# Patient Record
Sex: Male | Born: 1965 | Race: White | Hispanic: No | Marital: Married | Smoking: Former smoker
Health system: Southern US, Community
[De-identification: ages and names within clinical notes are randomized; demographics above are authoritative.]

## PROBLEM LIST (undated history)

## (undated) DIAGNOSIS — R51 Headache: Secondary | ICD-10-CM

## (undated) DIAGNOSIS — E785 Hyperlipidemia, unspecified: Secondary | ICD-10-CM

## (undated) DIAGNOSIS — I10 Essential (primary) hypertension: Secondary | ICD-10-CM

## (undated) DIAGNOSIS — I2510 Atherosclerotic heart disease of native coronary artery without angina pectoris: Secondary | ICD-10-CM

## (undated) DIAGNOSIS — R519 Headache, unspecified: Secondary | ICD-10-CM

## (undated) DIAGNOSIS — Z86010 Personal history of colonic polyps: Secondary | ICD-10-CM

## (undated) HISTORY — DX: Hyperlipidemia, unspecified: E78.5

## (undated) HISTORY — DX: Headache, unspecified: R51.9

## (undated) HISTORY — DX: Atherosclerotic heart disease of native coronary artery without angina pectoris: I25.10

## (undated) HISTORY — DX: Essential (primary) hypertension: I10

## (undated) HISTORY — DX: Personal history of colonic polyps: Z86.010

## (undated) HISTORY — PX: COLONOSCOPY: SHX174

## (undated) HISTORY — DX: Headache: R51

## (undated) HISTORY — PX: VASECTOMY: SHX75

## (undated) HISTORY — PX: CORONARY ANGIOPLASTY WITH STENT PLACEMENT: SHX49

---

## 1999-12-15 ENCOUNTER — Encounter (INDEPENDENT_AMBULATORY_CARE_PROVIDER_SITE_OTHER): Payer: Self-pay | Admitting: Specialist

## 1999-12-15 ENCOUNTER — Ambulatory Visit (HOSPITAL_COMMUNITY): Admission: RE | Admit: 1999-12-15 | Discharge: 1999-12-15 | Payer: Self-pay | Admitting: Gastroenterology

## 2002-12-19 ENCOUNTER — Encounter (INDEPENDENT_AMBULATORY_CARE_PROVIDER_SITE_OTHER): Payer: Self-pay | Admitting: Specialist

## 2002-12-19 ENCOUNTER — Ambulatory Visit (HOSPITAL_COMMUNITY): Admission: RE | Admit: 2002-12-19 | Discharge: 2002-12-19 | Payer: Self-pay | Admitting: Urology

## 2002-12-19 ENCOUNTER — Ambulatory Visit (HOSPITAL_BASED_OUTPATIENT_CLINIC_OR_DEPARTMENT_OTHER): Admission: RE | Admit: 2002-12-19 | Discharge: 2002-12-19 | Payer: Self-pay | Admitting: Urology

## 2004-03-02 ENCOUNTER — Ambulatory Visit (HOSPITAL_COMMUNITY): Admission: RE | Admit: 2004-03-02 | Discharge: 2004-03-02 | Payer: Self-pay | Admitting: Gastroenterology

## 2004-03-02 ENCOUNTER — Encounter (INDEPENDENT_AMBULATORY_CARE_PROVIDER_SITE_OTHER): Payer: Self-pay | Admitting: Specialist

## 2005-05-09 ENCOUNTER — Encounter: Admission: RE | Admit: 2005-05-09 | Discharge: 2005-05-09 | Payer: Self-pay | Admitting: Family Medicine

## 2008-05-27 ENCOUNTER — Ambulatory Visit: Payer: Self-pay | Admitting: Radiology

## 2008-05-27 ENCOUNTER — Ambulatory Visit: Payer: Self-pay | Admitting: Internal Medicine

## 2008-05-27 ENCOUNTER — Inpatient Hospital Stay (HOSPITAL_COMMUNITY): Admission: AD | Admit: 2008-05-27 | Discharge: 2008-05-29 | Payer: Self-pay | Admitting: Internal Medicine

## 2008-05-27 ENCOUNTER — Encounter: Payer: Self-pay | Admitting: Emergency Medicine

## 2008-05-28 ENCOUNTER — Encounter: Payer: Self-pay | Admitting: Cardiovascular Disease

## 2008-06-02 ENCOUNTER — Ambulatory Visit: Payer: Self-pay | Admitting: Cardiovascular Disease

## 2008-06-02 DIAGNOSIS — I10 Essential (primary) hypertension: Secondary | ICD-10-CM | POA: Insufficient documentation

## 2008-06-03 LAB — CONVERTED CEMR LAB
Calcium: 9.1 mg/dL (ref 8.4–10.5)
Chloride: 105 meq/L (ref 96–112)
GFR calc non Af Amer: 76.95 mL/min (ref >60)
Sodium: 144 meq/L (ref 135–145)

## 2008-06-09 ENCOUNTER — Telehealth: Payer: Self-pay | Admitting: Cardiovascular Disease

## 2008-06-23 DIAGNOSIS — E785 Hyperlipidemia, unspecified: Secondary | ICD-10-CM | POA: Insufficient documentation

## 2008-06-24 ENCOUNTER — Ambulatory Visit: Payer: Self-pay | Admitting: Cardiovascular Disease

## 2008-06-24 DIAGNOSIS — I2510 Atherosclerotic heart disease of native coronary artery without angina pectoris: Secondary | ICD-10-CM | POA: Insufficient documentation

## 2008-08-19 ENCOUNTER — Emergency Department (HOSPITAL_BASED_OUTPATIENT_CLINIC_OR_DEPARTMENT_OTHER): Admission: EM | Admit: 2008-08-19 | Discharge: 2008-08-19 | Payer: Self-pay | Admitting: Emergency Medicine

## 2008-08-19 ENCOUNTER — Encounter: Payer: Self-pay | Admitting: Cardiovascular Disease

## 2008-08-19 ENCOUNTER — Ambulatory Visit: Payer: Self-pay | Admitting: Interventional Radiology

## 2008-08-19 ENCOUNTER — Telehealth: Payer: Self-pay | Admitting: Cardiovascular Disease

## 2008-08-24 ENCOUNTER — Ambulatory Visit: Payer: Self-pay | Admitting: Cardiovascular Disease

## 2008-08-24 LAB — CONVERTED CEMR LAB
ALT: 28 U/L (ref 0–53)
AST: 25 U/L (ref 0–37)
Albumin: 4.1 g/dL (ref 3.5–5.2)
Alkaline Phosphatase: 55 U/L (ref 39–117)
Bilirubin, Direct: 0.1 mg/dL (ref 0.0–0.3)
Total Bilirubin: 1 mg/dL (ref 0.3–1.2)
Total Protein: 7 g/dL (ref 6.0–8.3)

## 2008-09-03 LAB — CONVERTED CEMR LAB
HDL: 42.6 mg/dL (ref >39.00)
Total CHOL/HDL Ratio: 4

## 2008-10-20 ENCOUNTER — Telehealth: Payer: Self-pay | Admitting: Cardiovascular Disease

## 2008-11-25 ENCOUNTER — Ambulatory Visit: Payer: Self-pay | Admitting: Cardiovascular Disease

## 2008-11-30 LAB — CONVERTED CEMR LAB
ALT: 25 units/L (ref 0–53)
Albumin: 4.1 g/dL (ref 3.5–5.2)
Bilirubin, Direct: 0 mg/dL (ref 0.0–0.3)
HDL: 34.4 mg/dL — ABNORMAL LOW (ref >39.00)
Total Bilirubin: 0.8 mg/dL (ref 0.3–1.2)
Total Protein: 6.3 g/dL (ref 6.0–8.3)
Triglycerides: 111 mg/dL (ref 0.0–149.0)
VLDL: 22.2 mg/dL (ref 0.0–40.0)

## 2008-12-11 ENCOUNTER — Telehealth: Payer: Self-pay | Admitting: Cardiovascular Disease

## 2009-02-10 ENCOUNTER — Telehealth: Payer: Self-pay | Admitting: Cardiovascular Disease

## 2009-03-29 ENCOUNTER — Telehealth: Payer: Self-pay | Admitting: Cardiovascular Disease

## 2009-08-05 ENCOUNTER — Telehealth: Payer: Self-pay | Admitting: Cardiovascular Disease

## 2009-09-10 ENCOUNTER — Ambulatory Visit: Payer: Self-pay | Admitting: Cardiovascular Disease

## 2009-09-14 ENCOUNTER — Telehealth: Payer: Self-pay | Admitting: Cardiovascular Disease

## 2010-03-06 LAB — CONVERTED CEMR LAB
ALT: 22 units/L (ref 0–53)
BUN: 17 mg/dL (ref 6–23)
Creatinine, Ser: 1.1 mg/dL (ref 0.4–1.5)
Glucose, Bld: 96 mg/dL (ref 70–99)
HDL: 36.6 mg/dL — ABNORMAL LOW (ref >39.00)
Total Bilirubin: 0.8 mg/dL (ref 0.3–1.2)
Total CHOL/HDL Ratio: 5
Total Protein: 7.2 g/dL (ref 6.0–8.3)
VLDL: 21.2 mg/dL (ref 0.0–40.0)

## 2010-03-08 NOTE — Assessment & Plan Note (Signed)
Summary: f1y   Visit Type:  Follow-up Primary Provider:  none  CC:  f1y.  Pt states he is feeling fine.  He was supposed to be back in January but did not receive a call to come back..  History of Present Illness: This is a 45 year old gentleman who presented with unstable angina 05/27/08. He underwent diagnostic catheterization that showed severe proximal LAD stenosis and subtotal occlusion of the distal left circumflex. The left circumflex was not suitable for PCI and his LAD was stented with a single drug-eluting stent. He presents today for followup.  He has been exercising regularly and denies exertional symptoms. No chest pain, dyspnea, edema, orthopnea, or PND. He has lost about 10 pounds. He reports compliance with his medical program and continues to abstain from cigarettes.     Current Medications (verified): 1)  Plavix 75 Mg Tabs (Clopidogrel Bisulfate) .... Take One Tablet By Mouth Daily 2)  Simvastatin 40 Mg Tabs (Simvastatin) .... Take One Tablet By Mouth Daily At Bedtime 3)  Metoprolol Tartrate 25 Mg Tabs (Metoprolol Tartrate) .... Take One Tab Once Daily 4)  Aspirin Ec 325 Mg Tbec (Aspirin) .... Take One Tablet By Mouth Daily 5)  Lisinopril 10 Mg Tabs (Lisinopril) .... Take One Tablet By Mouth Daily  Allergies (verified): No Known Drug Allergies  Past History:  Past medical history reviewed for relevance to current acute and chronic problems.  Past Medical History: Reviewed history from 06/23/2008 and no changes required. Current Problems:  CAD (ICD-414.00)- severe proximal left anterior descending stenosis stenting using a drug-eluting Xience stent  to proximal left anterior descending.   HYPERTENSION, BENIGN (ICD-401.1) DYSLIPIDEMIA (ICD-272.4)  Review of Systems       Negative except as per HPI   Vital Signs:  Patient profile:   45 year old male Height:      74 inches Weight:      231 pounds BMI:     29.77 Pulse rate:   68 / minute Pulse rhythm:    regular Resp:     16 per minute BP sitting:   124 / 78  (left arm) Cuff size:   regular  Vitals Entered By: Judithe Modest CMA (September 10, 2009 8:47 AM)  Physical Exam  General:  Pt is alert and oriented, in no acute distress. HEENT: normal Neck: normal carotid upstrokes without bruits, JVP normal Lungs: CTA CV: RRR without murmur or gallop Abd: soft, NT, positive BS, no bruit, no organomegaly Ext: no clubbing, cyanosis, or edema. peripheral pulses 2+ and equal Skin: warm and dry without rash    EKG  Procedure date:  09/10/2009  Findings:      NSR 68 bpm, within normal limits.  Impression & Recommendations:  Problem # 1:  CORONARY ATHEROSCLEROSIS NATIVE CORONARY ARTERY (ICD-414.01) Stable without angina. Continue current medical program with secondary risk reduction measures as below. Encouraged continued efforts at diet and exercise.  His updated medication list for this problem includes:    Plavix 75 Mg Tabs (Clopidogrel bisulfate) .Marland Kitchen... Take one tablet by mouth daily    Metoprolol Tartrate 25 Mg Tabs (Metoprolol tartrate) .Marland Kitchen... Take one tab once daily    Aspirin Ec 325 Mg Tbec (Aspirin) .Marland Kitchen... Take one tablet by mouth daily    Lisinopril 10 Mg Tabs (Lisinopril) .Marland Kitchen... Take one tablet by mouth daily  Orders: EKG w/ Interpretation (93000) TLB-BMP (Basic Metabolic Panel-BMET) (80048-METABOL) TLB-Lipid Panel (80061-LIPID) TLB-Hepatic/Liver Function Pnl (80076-HEPATIC)  Problem # 2:  HYPERTENSION, BENIGN (ICD-401.1)  Well-controlled on lisinopril  and metoprolol.  His updated medication list for this problem includes:    Metoprolol Tartrate 25 Mg Tabs (Metoprolol tartrate) .Marland Kitchen... Take one tab once daily    Aspirin Ec 325 Mg Tbec (Aspirin) .Marland Kitchen... Take one tablet by mouth daily    Lisinopril 10 Mg Tabs (Lisinopril) .Marland Kitchen... Take one tablet by mouth daily  BP today: 124/78 Prior BP: 124/86 (08/24/2008)  Labs Reviewed: K+: 3.8 (06/02/2008) Creat: : 1.1 (06/02/2008)   Chol:  165 (11/25/2008)   HDL: 34.40 (11/25/2008)   LDL: 108 (11/25/2008)   TG: 111.0 (11/25/2008)  Orders: TLB-BMP (Basic Metabolic Panel-BMET) (80048-METABOL) TLB-Lipid Panel (80061-LIPID) TLB-Hepatic/Liver Function Pnl (80076-HEPATIC)  Problem # 3:  DYSLIPIDEMIA (ICD-272.4)  LDL was a little above goal last year. Limited by need for generic Rx - continue simvastatin for now and repeat lipids/lft's.  His updated medication list for this problem includes:    Simvastatin 40 Mg Tabs (Simvastatin) .Marland Kitchen... Take one tablet by mouth daily at bedtime  CHOL: 165 (11/25/2008)   LDL: 108 (11/25/2008)   HDL: 34.40 (11/25/2008)   TG: 111.0 (11/25/2008)  Orders: TLB-BMP (Basic Metabolic Panel-BMET) (80048-METABOL) TLB-Lipid Panel (80061-LIPID) TLB-Hepatic/Liver Function Pnl (80076-HEPATIC)  Patient Instructions: 1)  Your physician recommends that you have lab work today. (Lipid, Liver and BMP) 2)  Your physician has requested that you have an exercise tolerance test in 1 YEAR.  For further information please visit [email or web address]  Please also follow instruction sheet, as given. 3)  Your physician recommends that you continue on your current medications as directed. Please refer to the Current Medication list given to you today.

## 2010-03-08 NOTE — Progress Notes (Signed)
Summary: Question about blood sugar  Phone Note Call from Patient Call back at [phone]   Summary of Call: Pt have question about blood sugar Initial call taken by: Judie Grieve,  August 05, 2009 2:09 PM  Follow-up for Phone Call        pt called concerning his blood sugar.  He has a friend that has a glucometer and he took his after eatting fried fish and hush puppies.  It was 270.  I told his that was not a fasting sample and he should get a fasting sample if he was concerned.  He was due to see Dr Excell Seltzer in 02/2009 and was never called for follow up.  Will get this scheduled and he will come in one week prior and get fasting labs.  He says he feels fine playing tennis weekly w/o shet pain or any difficulty. Dennis Bast, RN, BSN  August 05, 2009 2:34 PM

## 2010-03-08 NOTE — Progress Notes (Signed)
Summary: Chantix  Phone Note Call from Patient Call back at Home Phone [phone] Call back at [phone] Message from:  Patient on March 29, 2009 12:39 PM  Refills Requested: Medication #1:  CHANTIX STARTING MONTH PAK 0.5 MG X 11 & 1 MG X 42 TABS take as directed. [facility] [address]   Method Requested: Fax to Local Pharmacy Initial call taken by: Lorne Skeens,  March 29, 2009 12:40 PM Caller: Patient Reason for Call: Talk to Nurse Details for Reason: Per pt calling, wife stacy Jorden also need rx for chantix Initial call taken by: Lorne Skeens,  March 29, 2009 12:41 PM  Follow-up for Phone Call        I spoke with the pt and both he and his wife just completed the starter pack of Chantix.  They now need the monthly dose pack.  Rx for the pt's wife was called into [facility]. Follow-up by: Julieta Gutting, RN, BSN,  March 29, 2009 1:56 PM    New/Updated Medications: CHANTIX CONTINUING MONTH PAK 1 MG TABS (VARENICLINE TARTRATE) take as directed Prescriptions: CHANTIX CONTINUING MONTH PAK 1 MG TABS (VARENICLINE TARTRATE) take as directed  #1 x 2   Entered by:   Julieta Gutting, RN, BSN   Authorized by:   Norva Karvonen, MD   Signed by:   Julieta Gutting, RN, BSN on 03/29/2009   Method used:   Electronically to        [facility] [address] [id number] (retail)       [address]       [place], [place]  [address]       Ph: [phone]       Fax: [phone]   RxID:   [id number]

## 2010-03-08 NOTE — Progress Notes (Signed)
Summary: Chantix  Phone Note Call from Patient Call back at [phone]   Caller: Patient Reason for Call: Talk to Nurse Summary of Call: Patient would like to speak to Lauren. Initial call taken by: Burnard Leigh,  February 10, 2009 11:41 AM  Follow-up for Phone Call        Pt called the office because he and his wife did not pick-up the Chantix prescription that was prescribed in November.  The pt and his wife would like to start Chantix at this time.  The pt's prescription is still on file at the pharmacy but the pharmacy cannot locate Rx for Tim Edwards DOB 11/25/1967.  I called [facility] and gave new presciptions for both the pt and his wife.  The pt will call back to schedule an appt in February with Dr Excell Seltzer.  Follow-up by: Julieta Gutting, RN, BSN,  February 10, 2009 1:09 PM

## 2010-03-08 NOTE — Progress Notes (Signed)
Summary:  lab results  Phone Note Call from Patient Call back at Home Phone [phone]   Caller: Patient Reason for Call: Talk to Nurse, Talk to Doctor, Lab or Test Results Summary of Call: pt would like lab results from Friday Initial call taken by: Omer Jack,  September 14, 2009 12:09 PM  Follow-up for Phone Call        I spoke with the pt about the results of his labwork.  Currently cost is an issue with his medications.  The pt said he would talk with his pharmacist about what Crestor may cost him.  I spoke with the pt about improving his diet and continuing to exercise. The pt will call back if he can afford Crestor.  Follow-up by: Julieta Gutting, RN, BSN,  September 14, 2009 12:54 PM

## 2010-05-10 ENCOUNTER — Other Ambulatory Visit: Payer: Self-pay | Admitting: Cardiovascular Disease

## 2010-05-15 LAB — COMPREHENSIVE METABOLIC PANEL
Calcium: 9 mg/dL (ref 8.4–10.5)
Chloride: 107 mEq/L (ref 96–112)
GFR calc Af Amer: >60 mL/min (ref >60)
Sodium: 146 mEq/L — ABNORMAL HIGH (ref 135–145)
Total Bilirubin: 0.6 mg/dL (ref 0.3–1.2)
Total Protein: 7.5 g/dL (ref 6.0–8.3)

## 2010-05-15 LAB — CBC
HCT: 42.9 % (ref 39.0–52.0)
Hemoglobin: 14.5 g/dL (ref 13.0–17.0)
MCHC: 33.8 g/dL (ref 30.0–36.0)
MCV: 86.7 fL (ref 78.0–100.0)
Platelets: 149 10*3/uL — ABNORMAL LOW (ref 150–400)
RDW: 11.9 % (ref 11.5–15.5)
WBC: 6.9 10*3/uL (ref 4.0–10.5)

## 2010-05-15 LAB — DIFFERENTIAL
Eosinophils Absolute: 0.1 10*3/uL (ref 0.0–0.7)
Lymphocytes Relative: 21 % (ref 12–46)
Neutrophils Relative %: 68 % (ref 43–77)

## 2010-05-15 LAB — POCT CARDIAC MARKERS
Myoglobin, poc: 73.6 ng/mL (ref 12–200)
Troponin i, poc: <0.05 ng/mL (ref 0.00–0.09)

## 2010-05-15 LAB — LIPASE, BLOOD: Lipase: 53 U/L (ref 23–300)

## 2010-05-18 LAB — CBC
Hemoglobin: 14.5 g/dL (ref 13.0–17.0)
MCHC: 35.2 g/dL (ref 30.0–36.0)
MCV: 86.7 fL (ref 78.0–100.0)
MCV: 87.6 fL (ref 78.0–100.0)
Platelets: 112 10*3/uL — ABNORMAL LOW (ref 150–400)
RBC: 4.78 MIL/uL (ref 4.22–5.81)
RBC: 5.3 MIL/uL (ref 4.22–5.81)
RBC: 5.4 MIL/uL (ref 4.22–5.81)
WBC: 6.2 10*3/uL (ref 4.0–10.5)
WBC: 6.9 10*3/uL (ref 4.0–10.5)

## 2010-05-18 LAB — BASIC METABOLIC PANEL
BUN: 13 mg/dL (ref 6–23)
CO2: 27 mEq/L (ref 19–32)
Calcium: 8.8 mg/dL (ref 8.4–10.5)
Chloride: 106 mEq/L (ref 96–112)
Chloride: 106 mEq/L (ref 96–112)
Creatinine, Ser: 0.9 mg/dL (ref 0.4–1.5)
Creatinine, Ser: 1.08 mg/dL (ref 0.4–1.5)
GFR calc Af Amer: >60 mL/min (ref >60)
GFR calc Af Amer: >60 mL/min (ref >60)
Glucose, Bld: 99 mg/dL (ref 70–99)
Potassium: 4 mEq/L (ref 3.5–5.1)
Sodium: 139 mEq/L (ref 135–145)

## 2010-05-18 LAB — CARDIAC PANEL(CRET KIN+CKTOT+MB+TROPI)
CK, MB: 2.1 ng/mL (ref 0.3–4.0)
Relative Index: 0.4 (ref 0.0–2.5)
Relative Index: 0.4 (ref 0.0–2.5)
Total CK: 470 U/L — ABNORMAL HIGH (ref 7–232)
Troponin I: 0.01 ng/mL (ref 0.00–0.06)
Troponin I: 0.02 ng/mL (ref 0.00–0.06)

## 2010-05-18 LAB — DIFFERENTIAL
Basophils Absolute: 0 10*3/uL (ref 0.0–0.1)
Basophils Relative: 1 % (ref 0–1)
Eosinophils Relative: 2 % (ref 0–5)
Lymphs Abs: 1.8 10*3/uL (ref 0.7–4.0)
Monocytes Absolute: 0.3 10*3/uL (ref 0.1–1.0)
Monocytes Absolute: 0.4 10*3/uL (ref 0.1–1.0)
Monocytes Relative: 5 % (ref 3–12)
Monocytes Relative: 7 % (ref 3–12)
Neutro Abs: 4.1 10*3/uL (ref 1.7–7.7)
Neutrophils Relative %: 60 % (ref 43–77)

## 2010-05-18 LAB — COMPREHENSIVE METABOLIC PANEL
AST: 21 U/L (ref 0–37)
Calcium: 8.8 mg/dL (ref 8.4–10.5)
Chloride: 110 mEq/L (ref 96–112)
GFR calc Af Amer: >60 mL/min (ref >60)
GFR calc non Af Amer: >60 mL/min (ref >60)
Potassium: 4.5 mEq/L (ref 3.5–5.1)
Sodium: 144 mEq/L (ref 135–145)

## 2010-05-18 LAB — POCT CARDIAC MARKERS: CKMB, poc: 1.7 ng/mL (ref 1.0–8.0)

## 2010-05-18 LAB — LIPID PANEL
Cholesterol: 267 mg/dL — ABNORMAL HIGH (ref 0–200)
HDL: 30 mg/dL — ABNORMAL LOW (ref >39)
Total CHOL/HDL Ratio: 8.9 RATIO
VLDL: 31 mg/dL (ref 0–40)

## 2010-05-18 LAB — PROTIME-INR: Prothrombin Time: 13.9 seconds (ref 11.6–15.2)

## 2010-05-23 ENCOUNTER — Ambulatory Visit (INDEPENDENT_AMBULATORY_CARE_PROVIDER_SITE_OTHER): Payer: BLUE CROSS/BLUE SHIELD | Admitting: Physician Assistant

## 2010-05-23 ENCOUNTER — Telehealth: Payer: Self-pay | Admitting: Cardiovascular Disease

## 2010-05-23 ENCOUNTER — Encounter: Payer: Self-pay | Admitting: Physician Assistant

## 2010-05-23 VITALS — BP 122/92 | HR 59 | Resp 18 | Ht 74.0 in | Wt 234.0 lb

## 2010-05-23 DIAGNOSIS — I2510 Atherosclerotic heart disease of native coronary artery without angina pectoris: Secondary | ICD-10-CM

## 2010-05-23 DIAGNOSIS — R079 Chest pain, unspecified: Secondary | ICD-10-CM

## 2010-05-23 DIAGNOSIS — R42 Dizziness and giddiness: Secondary | ICD-10-CM

## 2010-05-23 DIAGNOSIS — E785 Hyperlipidemia, unspecified: Secondary | ICD-10-CM

## 2010-05-23 DIAGNOSIS — Z720 Tobacco use: Secondary | ICD-10-CM | POA: Insufficient documentation

## 2010-05-23 NOTE — Telephone Encounter (Signed)
Left message for pt to call back.  Pt scheduled to see Tereso Newcomer PA-C today at 12:00.

## 2010-05-23 NOTE — Progress Notes (Signed)
History of Present Illness: Primary Cardiologist: Dr. Tonny Bollman  Tim Edwards is a 45 y.o. male with a h/o CAD, s/p DES to LAD in 05/2008, chronically occluded left PDA with preserved LVF.  He returns for follow up.  He is somewhat anxious about his diagnosis of CAD.  He wants to get "checked out."  He is still smoking.  He denies chest pain reminiscent of his previous angina.  He is quite active.  He mows his lawn and is able to exercise without chest pain.  He does have occasional chest pain that feels like gas and he takes TUMS or belches with relief.  He does note some DOE.  This is stable and mild.  He denies any changes.  He denies syncope.  He denies orthopnea or PND.  He denies edema.  He has had episodic lightheadedness/dizziness.  This seems to be related to lack of eating.  His symptoms promptly get better with eating.  He is worried he may have diabetes.  Past Medical History  Diagnosis Date  . CAD (coronary artery disease)     a. s/p Xience DES to LAD 4/10;  b. cath 05/28/08: pLAD 90% (tx with DES); left PDA occluded, RCA ok; EF 55%  . Hypertension   . Dyslipidemia     Current Outpatient Prescriptions  Medication Sig Dispense Refill  . aspirin EC 325 MG EC tablet Take 325 mg by mouth daily.        . clopidogrel (PLAVIX) 75 MG tablet Take 75 mg by mouth daily.        Marland Kitchen lisinopril (PRINIVIL,ZESTRIL) 10 MG tablet Take 10 mg by mouth daily.        . metoprolol succinate (TOPROL-XL) 25 MG 24 hr tablet Take 25 mg by mouth daily.        . simvastatin (ZOCOR) 40 MG tablet TAKE ONE TABLET BY MOUTH NIGHTLY AT BEDTIME  90 tablet  0    No Known Allergies  Vital Signs: BP 122/92  Pulse 59  Resp 18  Ht 6\' 2"  (1.88 m)  Wt 234 lb (106.142 kg)  BMI 30.04 kg/m2  PHYSICAL EXAM: Well nourished, well developed, in no acute distress HEENT: normal Neck: no JVD Vascular: no carotid bruits Cardiac:  normal S1, S2; RRR; no murmur Lungs:  clear to auscultation bilaterally, no wheezing,  rhonchi or rales Abd: soft, nontender, no hepatomegaly Ext: no edema Skin: warm and dry Neuro:  CNs 2-12 intact, no focal abnormalities noted Psych: normal affect  EKG:  Sinus bradycardia, HR 59, normal axis, no ischemic changes.  ASSESSMENT AND PLAN:

## 2010-05-23 NOTE — Telephone Encounter (Signed)
The pt wanted to know if the PA can order lab work at his appointment today.  I made the pt aware that the PA can order tests if felt medically necessary. The pt thinks he is having problems with his blood glucose.  The pt does not have a PCP. The pt will be seen today in the office.

## 2010-05-23 NOTE — Assessment & Plan Note (Signed)
We discussed the importance of cessation.  

## 2010-05-23 NOTE — Assessment & Plan Note (Signed)
Borderline control.  We discussed proper diet and smoking cessation.

## 2010-05-23 NOTE — Assessment & Plan Note (Signed)
Check fasting sugar with CMET on day of ETT and also obtain Hgb A1C to r/o diabetes.  If within normal ranges, suggest continued attention to diet.  It sounds like he is having hypoglycemic episodes mainly related to poor diet.

## 2010-05-23 NOTE — Telephone Encounter (Signed)
Pt has question re his appt today and would like talk to lauren before his appt

## 2010-05-23 NOTE — Patient Instructions (Signed)
Your physician recommends that you schedule a follow-up appointment in: 12 months with Dr. Excell Seltzer Your physician recommends that you return for fasting lab work on day of treadmill Your physician has requested that you have an exercise tolerance test. For further information please visit [email or web address] Please also follow instruction sheet, as given.

## 2010-05-23 NOTE — Assessment & Plan Note (Signed)
Obtain ETT as noted.  Will continue ASA and Plavix.  If ETT negative, reassurance and follow up with Dr. Excell Seltzer in 12 months.

## 2010-05-23 NOTE — Assessment & Plan Note (Signed)
Atypical.  Sounds like GI.  He is concerned about his overall health.  Will arrange ETT.

## 2010-05-23 NOTE — Assessment & Plan Note (Signed)
Arrange CMET and lipids day of ETT.

## 2010-06-08 ENCOUNTER — Ambulatory Visit (INDEPENDENT_AMBULATORY_CARE_PROVIDER_SITE_OTHER): Payer: BC Managed Care – PPO | Admitting: Physician Assistant

## 2010-06-08 ENCOUNTER — Other Ambulatory Visit (INDEPENDENT_AMBULATORY_CARE_PROVIDER_SITE_OTHER): Payer: BC Managed Care – PPO | Admitting: *Deleted

## 2010-06-08 DIAGNOSIS — R079 Chest pain, unspecified: Secondary | ICD-10-CM

## 2010-06-08 DIAGNOSIS — I2510 Atherosclerotic heart disease of native coronary artery without angina pectoris: Secondary | ICD-10-CM

## 2010-06-08 DIAGNOSIS — R42 Dizziness and giddiness: Secondary | ICD-10-CM

## 2010-06-08 DIAGNOSIS — E785 Hyperlipidemia, unspecified: Secondary | ICD-10-CM

## 2010-06-08 LAB — HEPATIC FUNCTION PANEL
AST: 30 U/L (ref 0–37)
Albumin: 4.6 g/dL (ref 3.5–5.2)
Alkaline Phosphatase: 54 U/L (ref 39–117)
Total Protein: 7.5 g/dL (ref 6.0–8.3)

## 2010-06-08 LAB — BASIC METABOLIC PANEL
BUN: 17 mg/dL (ref 6–23)
CO2: 15 mEq/L — ABNORMAL LOW (ref 19–32)
Calcium: 9.4 mg/dL (ref 8.4–10.5)
Glucose, Bld: 87 mg/dL (ref 70–99)
Sodium: 140 mEq/L (ref 135–145)

## 2010-06-08 LAB — LIPID PANEL
Cholesterol: 183 mg/dL (ref 0–200)
HDL: 38.6 mg/dL — ABNORMAL LOW (ref >39.00)

## 2010-06-08 NOTE — Progress Notes (Signed)
Exercise Treadmill Test  Pre-Exercise Testing Evaluation Rhythm: sinus bradycardia  Rate: 56   PR:  .16 QRS:  .10  QT:  .40 QTc: .38     Test  Exercise Tolerance Test Ordering MD: Tonny Bollman, MD  Interpreting MD:  Tereso Newcomer PA-C  Unique Test No: 1  Treadmill:  1  Indication for ETT: exertional dyspnea  Contraindication to ETT: No   Stress Modality: exercise - treadmill  Cardiac Imaging Performed: non   Protocol: standard Bruce - maximal  Max BP: 201/66  Max MPHR (bpm):  174 85% MPR (bpm):  147  MPHR obtained (bpm):  148 % MPHR obtained: 85%  Reached 85% MPHR (min:sec): 9:40 Total Exercise Time (min-sec): 9:49  Workload in METS: 13.0 Borg Scale: 17  Reason ETT Terminated:  dyspnea    ST Segment Analysis At Rest: normal ST segments - no evidence of significant ST depression With Exercise: no evidence of significant ST depression  Other Information Arrhythmia:  No Angina during ETT:  absent (0) Quality of ETT:  diagnostic  ETT Interpretation:  normal - no evidence of ischemia by ST analysis  Comments: Good exercise tolerance. Patient took Toprol last night. Normal BP response. No chest pain. No ST changes on ECG to suggest ischemia.  Recommendations: Follow up with Dr. Excell Seltzer in one year.

## 2010-06-09 ENCOUNTER — Encounter: Payer: Self-pay | Admitting: Cardiovascular Disease

## 2010-06-09 ENCOUNTER — Telehealth: Payer: Self-pay | Admitting: Cardiovascular Disease

## 2010-06-09 LAB — HEMOGLOBIN A1C: Hgb A1c MFr Bld: 5.6 % (ref 4.6–6.5)

## 2010-06-09 MED ORDER — ATORVASTATIN CALCIUM 40 MG PO TABS: 40.0000 mg | ORAL_TABLET | Freq: Every day | ORAL | Status: DC

## 2010-06-09 NOTE — Telephone Encounter (Signed)
See result note.  

## 2010-06-09 NOTE — Telephone Encounter (Signed)
Error

## 2010-06-09 NOTE — Telephone Encounter (Signed)
Pt calling re bloodwork results.

## 2010-06-09 NOTE — Telephone Encounter (Signed)
Per documentation on the pt's lab work Danielle Rankin spoke with patient.

## 2010-06-10 ENCOUNTER — Encounter: Payer: Self-pay | Admitting: *Deleted

## 2010-06-10 ENCOUNTER — Telehealth: Payer: Self-pay | Admitting: *Deleted

## 2010-06-10 DIAGNOSIS — E785 Hyperlipidemia, unspecified: Secondary | ICD-10-CM

## 2010-06-10 NOTE — Telephone Encounter (Signed)
Explained to pt he will have his flp/lft drawn at the [facility] office during his appt with Dr. Yetta Barre on 07/20/10 @ 9:30. Danielle Rankin

## 2010-06-10 NOTE — Telephone Encounter (Signed)
See phone note

## 2010-06-21 NOTE — H&P (Signed)
[name], Tim NO.:  [id number]   MEDICAL RECORD NO.:  [id number]          PATIENT TYPE:  INP   LOCATION:  4742                         FACILITY:  [facility]   PHYSICIAN:  Duke Salvia, MD, FACCDATE OF BIRTH:  02/15/1965   DATE OF ADMISSION:  05/27/2008  DATE OF DISCHARGE:                              HISTORY & PHYSICAL   PRIMARY CARDIOLOGIST:  New, being seen by Dr. Sherryl Manges.   [name] is a 45 year old Caucasian gentleman, who presented to [facility] complaining of chest discomfort.  He states he has been  having intermittent chest discomfort x1 month.  He was not having chest  pain today, but felt that he need to get evaluated because he felt like  it was becoming unbearable, is brought on with exertion.  He notices  especially when he mows his lawn about 15 minutes into the activity.  He  experiences this intermittent pressure in his chest, it is nonradiating,  but he does become short of breath.  He stops mowing and the discomfort  goes away.  He also complains of shortness of breath x1 month.  He notes  this while walking up steps at work.  He is a very sedentary gentleman,  who smokes 2 packs a day.  He has never experienced anything like this  before a month ago.  He states he just not feeling good for a month  also.  There is no consistency in the discomfort.  No increase in  frequency or severity.  He rates it a 5 on a scale of 1-10 when it  occurs.  It is nonradiating, intermittent pressure.  Denies any other  symptoms other than stated except for initially he thought it was bad  indigestion, he took Tums and tried other antacids without any relief,  so he stopped taking these.  He also states he has noticed he has some  numbness and tingling in the fingers on his right hand, has not had any  repetitive motions or movements and this he states started about 30 days  ago also.  He denies any presyncope or syncopal episodes,  blurred  vision, lightheadedness or dizziness with it.  He does not know of any  history of hypertension, although he does not frequent a doctor's  office.   PAST MEDICAL HISTORY:  Positive for tobacco addiction, remote history of  vasectomy, and colonoscopy.   SOCIAL HISTORY:  He lives in [place] with his wife.  He works at  [employer] in [place].  He smokes 2 packs of cigarettes a day,  has done so for 20 years.  He drinks socially.  Denies any recreational  substance use.  No diet restrictions.  Absolutely no exercise per  patient.   FAMILY HISTORY:  Mother deceased secondary to a brain aneurysm.  Father  deceased secondary to some type of cancer.  He has one sibling alive and  well.   REVIEW OF SYSTEMS:  As stated above including chest pain, shortness of  breath, right hand tingling and GERD symptoms.  All other systems  reviewed and  negative.   ALLERGIES:  No known drug allergies.   MEDICATIONS:  None.   PHYSICAL EXAMINATION:  VITAL SIGNS:  Temperature 97.6, heart rate 58,  respirations 20, and blood pressure 137/90.  GENERAL:  No acute distress, currently pain free.  HEENT:  Unremarkable.  NECK:  Supple without lymphadenopathy, bruit, or JVD.  CARDIOVASCULAR:  S1 and S2.  Regular rate and rhythm without murmurs,  rubs, or gallops.  LUNGS:  Clear to auscultation bilaterally.  SKIN:  Warm and dry.  ABDOMEN:  Soft, nontender, positive bowel sounds.  EXTREMITIES:  Lower extremities without clubbing, cyanosis, or edema.  Positive pedals.  NEUROLOGIC:  Alert and oriented x3, normal affect.   C[facility] obtained at [facility].  No acute findings.  EKG,  sinus rhythm, T-wave inversions in V1 through V3.   LABORATORY DATA:  H and H 15.7 and 44.8, WBC 6.3, platelets 154,000.  Sodium 142, potassium 4.0, BUN 13, creatinine 0.9, glucose 99.  Point of  cares negative x1 set.   IMPRESSION:  1. Chest pain concerning for angina.  No previous workup.  2.  Hypertension, new diagnosis.  3. Tobacco addiction with a 2 pack a day history.  4. Sedentary lifestyle.  5. Right hand numbness and tingling.   The patient will be admitted, cycle cardiac markers, anticoagulate with  heparin; initiate aspirin, statin, beta-blocker therapy.  Check fasting  lipids, proceed with cardiac catheterization in the morning.  The  patient will need inpatient or outpatient workup of the right hand  tingling, unclear etiology.  Dr. Sherryl Manges has been into exam and  assessed the patient and agrees with plan of care.      Dorian Pod, ACNP      Duke Salvia, MD, [facility]  Electronically Signed    MB/MEDQ  D:  05/28/2008  T:  05/28/2008  Job:  [id number]

## 2010-06-21 NOTE — Discharge Summary (Signed)
NAMEJAVAUGHN, OPDAHL NO.:  [id number]   MEDICAL RECORD NO.:  [id number]          PATIENT TYPE:  INP   LOCATION:  2508                         FACILITY:  [facility]   PHYSICIAN:  Veverly Fells. Excell Seltzer, MD  DATE OF BIRTH:  1965/02/24   DATE OF ADMISSION:  05/27/2008  DATE OF DISCHARGE:  05/29/2008                               DISCHARGE SUMMARY   PRIMARY CARDIOLOGIST:  Veverly Fells. Excell Seltzer, MD   PRIMARY CARE PHYSICIAN:  No primary care physician at this time.   DISCHARGING DIAGNOSES:  1. Coronary artery disease, status post cardiac catheterization.  The      patient with severe proximal left anterior descending stenosis,      status post successful stenting using a drug-eluting Xience stent      to proximal left anterior descending.  Dominant left circumflex      with subtotal occlusion of the left posterior descending artery,      unfavorable for percutaneous coronary intervention.  Mild segmental      left ventricular dysfunction with overall preserved ejection      fraction.  Recommendation for a dual antiplatelet therapy with      aspirin and Plavix for a minimum of 12 months.  The patient has      addiction, smoking cessation education provided along with      prescription medications.  2. Hypertension.  Adjustments made in medications.  3. Sedentary lifestyle.  The patient to participate in outpatient      cardiac rehabilitation.  4. Dyslipidemia.  New diagnosis, statin therapy initiated.  The      patient will need outpatient LFT and lipids.   HOSPITAL COURSE:  Mr. Anaya is a 45 year old Caucasian gentleman with  x1 month of intermittent chest discomfort usually brought on with  exertion, denies increase in frequency or intensity.  He decided to  finally get evaluated.  He was seen at the [facility] for  evaluation.  The patient also complained of numbness and tingling in his  fingers on both hands.  Initially, stated just the right hand, the  symptoms  have continued without any neurological deficits.  The patient  admitted mildly hypertensive 137/90, point of care markers were  negative.  The patient for further diagnostic evaluation to the cath lab  on May 28, 2008, results as stated above.  The patient tolerated  procedure without complications.  Complained of some mild pain in the  cath groin site stable, still mildly hypertensive.  ACE inhibitor  therapy initiated, will need to be titrated up.  The patient also on low-  dose beta-blocker.  He had some asymptomatic bradycardia noted during  the night.  We will leave further titration of this up to Dr. Excell Seltzer.  The patient refused to talk with the tobacco cessation nurse; however,  he was willing to speak with me and Dr. Excell Seltzer regarding tobacco use and  treatment alternatives.  Cardiac rehabilitation also spoke with the  patient and is arranging for outpatient rehab at [facility].  On day of  discharge; blood pressure 137/90, 97% on room air, afebrile,  and heart  rate 58-83.  Hematocrit 41.9.  Potassium 4.5 and creatinine 0.9.  TSH  1.35, total cholesterol 267, triglycerides 154, HDL 30, and LDL 206.  The patient has maintained sinus rhythm and sinus brady.  At time of  discharge, I have scheduled him to follow up with Dr. Excell Seltzer on Jun 24, 2008, at 3:45.  He will need to come in on for blood work on June 02, 2008, for a BMET with the setting of ACE inhibitor therapy for  hypertension.  We will need to arrange outpatient fasting lipids and  LFTs in 4-6 weeks through our office.  I have encouraged the patient to  establish care with a primary care physician.   DISCHARGE MEDICATIONS:  1. Lipitor 40 mg daily.  2. Toprol-XL 25.  3. Aspirin 325.  4. Plavix 75.  5. Lisinopril 10 mg.  6. Nicotine patch 21 mg patch topical every 24 hours, slowly decrease      dose down to 7 mg patch, and then discontinue nitroglycerin as      needed.  7. Xanax.  I have given him 30 tablets with  no refills.  8. Vicodin with no refills for his groin pain.   He has also been given the post cardiac catheterization supplemental  instructions.   DURATION OF DISCHARGE ENCOUNTER:  Over 30 minutes.      Dorian Pod, ACNP      Veverly Fells. Excell Seltzer, MD  Electronically Signed    MB/MEDQ  D:  05/29/2008  T:  05/29/2008  Job:  [id number]

## 2010-06-24 NOTE — Op Note (Signed)
   NAME:  BOOKERT, GUZZI                         ACCOUNT NO.:  [id number]   MEDICAL RECORD NO.:  [id number]                   PATIENT TYPE:  AMB   LOCATION:  NESC                                 FACILITY:  [facility]   PHYSICIAN:  Boston Service, M.D.             DATE OF BIRTH:  15-Feb-1965   DATE OF PROCEDURE:  12/19/2002  DATE OF DISCHARGE:                                 OPERATIVE REPORT   PREOPERATIVE DIAGNOSIS:  Undesired fertility.   POSTOPERATIVE DIAGNOSIS:  Undesired fertility.   OPERATION/PROCEDURE:  Outpatient vasectomy.   DESCRIPTION OF PROCEDURE:  The patient was prepped and draped in the supine  position after institution of an adequate level of general anesthesia.  Transverse incision was made in the anterior aspect of the left hemiscrotum  at a spot just above the vas.  Needle-tip cautery used to transverse the  skin and dartos.  Vas was identified, grasped with ring forceps, cleaned of  its surrounding attachments.  It was then divided.  Proximal end of the vas,  the end closest to the testicle was then folded back on itself with a single  stitch of 3-0 chromic.  Distal end of the vas was cauterized by placing the  tip of the needle-tip Bovie down the lumen of the vas and cauterizing.  Dartos was closed with a single stitch of 3-0 chromic.  Skin was closed with  a single stitch of 3-0 chromic.  Both segments were sent to pathology.  The  patient was given an athletic supporter and ice bag and returned to the  recovery room in satisfactory condition.                                               Boston Service, M.D.    RH/MEDQ  D:  12/19/2002  T:  12/19/2002  Job:  [id number]

## 2010-06-24 NOTE — Op Note (Signed)
[name], Tim Edwards               ACCOUNT NO.:  [id number]   MEDICAL RECORD NO.:  [id number]          PATIENT TYPE:  AMB   LOCATION:  ENDO                         FACILITY:  [facility]   PHYSICIAN:  Petra Kuba, M.D.    DATE OF BIRTH:  09-Dec-1965   DATE OF PROCEDURE:  03/02/2004  DATE OF DISCHARGE:                                 OPERATIVE REPORT   PROCEDURE:  Colonoscopy with biopsy.   INDICATION:  Family history of colon cancer, personal history of colon  polyps, due for repeat screening.   Consent was signed after the risks, benefits, methods, and options were  thoroughly discussed in the past.   MEDICATIONS USED:  Demerol 100 mg, Versed 7 mg.   DESCRIPTION OF PROCEDURE:  Rectal inspection was pertinent for external  hemorrhoids, small.  Digital exam was negative.  The video  colonoscope was  inserted and easily advanced around the colon to the cecum.  On insertion  some rare left and right diverticula were seen but no other abnormalities.  The cecum was identified by the appendiceal orifice and the ileocecal valve.  The scope was slowly withdrawn.  The prep was adequate, there was some  liquid stool that required washing and suctioning.  The right side of the  colon was normal except for the rare diverticula and the scope was withdrawn  round the left side.  Two tiny descending questionable polyps were seen and  were cold biopsied as well as two tiny questionable sigmoid polyps which  were cold biopsied as well - all possible polyps and specimens were put in  the same container.  No other abnormalities were seen as we slowly withdrew  back to the rectum.  Anorectal pull-through and retroflexion confirmed some  small hemorrhoids.  The scope was straightened and readvanced to the left  side of the colon, air was suctioned and the scope removed.  The patient  tolerated the procedure well and there were no obvious immediate  complications.   ENDOSCOPIC DIAGNOSES:  1.  Internal  and external small hemorrhoids.  2.  Rare left and right diverticulum  3.  Four tiny questionable sigmoid and descending polyps, cold biopsied.  4.  Otherwise within normal limits to the cecum.   PLAN:  Await pathology to determine future colonic screening, otherwise  happy to see back p.r.n.      MEM/MEDQ  D:  03/02/2004  T:  03/02/2004  Job:  [id number]

## 2010-06-24 NOTE — Procedures (Signed)
[facility]  Patient:    Tim Edwards, Tim Edwards                        MRN: [id number] Proc. Date: 12/15/99 Adm. Date:  [id number] Attending:  Nelda Marseille CC:         Evette Georges, M.D. [facility]   Procedure Report  PROCEDURE:  Colonoscopy with hot biopsy.  ENDOSCOPIST:  Petra Kuba, M.D.  INDICATIONS:  Patient with family history of colon cancer, both sides of the family, requesting colonic screening.  Consent was signed after risks, benefits, methods, and options were thoroughly discussed in the office.  MEDICINES USED:  Demerol 70, Versed 7.  DESCRIPTION OF PROCEDURE:  Rectum was inspected and was pertinent for external hemorrhoids.  Digital exam was negative.  Video colonoscope was inserted and very easily advanced around the colon to the cecum.  This required no abdominal pressure or position changes.  The cecum was identified by the appendiceal orifice and the ileocecal valve. In fact, the scope was advanced a short way in the terminal ileum which was normal.  Photo documentation was obtained.  The scope was slowly withdrawn.  The prep was adequate.  There was minimal liquid stool that required washing and suctioning.  On slow withdrawal of scope through the colon, the cecum, ascending, and transverse were normal. As the scope was withdrawn around the left side of the colon in the descending, two tiny to small polyps were seen and each hot biopsied x 1 or 2. No other abnormalities were seen as we slowly withdrew back to the rectum. Once back in the rectum, the scope as retroflexed and was pertinent for some internal hemorrhoids and a small anal papilla and tag.  The scope was straightened.  Anorectal pull-through confirmed the above.  The scope was reinserted a short way up the left side of the colon.  Air was suctioned, scope removed.  The patient tolerated the procedure well.  There was no obvious immediate complication.  ENDOSCOPIC  DIAGNOSIS: 1. Internal and external hemorrhoids with small tag and papilla. 2. Two tiny to small left-sided descending polyps status post hot biopsy. 3. Otherwise within normal limits to the terminal ileum.  PLAN:  Await pathology but probably recheck colon screening in five years. Otherwise, one week customary post polypectomy instructions and yearly rectals and guaiacs per Dr. Tawanna Cooler.  Will see back p.r.n. DD:  12/15/99 TD:  12/16/99 Job: [id number]

## 2010-07-04 ENCOUNTER — Other Ambulatory Visit: Payer: Self-pay | Admitting: *Deleted

## 2010-07-04 MED ORDER — CLOPIDOGREL BISULFATE 75 MG PO TABS: 75.0000 mg | ORAL_TABLET | Freq: Every day | ORAL | Status: DC

## 2010-07-12 ENCOUNTER — Ambulatory Visit: Payer: BC Managed Care – PPO | Admitting: Internal Medicine

## 2010-07-20 ENCOUNTER — Other Ambulatory Visit: Payer: BC Managed Care – PPO

## 2010-07-20 ENCOUNTER — Ambulatory Visit: Payer: BC Managed Care – PPO | Admitting: Internal Medicine

## 2010-07-20 ENCOUNTER — Other Ambulatory Visit: Payer: Self-pay | Admitting: Physician Assistant

## 2010-08-07 ENCOUNTER — Other Ambulatory Visit: Payer: Self-pay | Admitting: Cardiovascular Disease

## 2010-08-08 ENCOUNTER — Other Ambulatory Visit: Payer: Self-pay | Admitting: *Deleted

## 2010-08-08 MED ORDER — METOPROLOL SUCCINATE ER 25 MG PO TB24: 25.0000 mg | ORAL_TABLET | Freq: Every day | ORAL | Status: DC

## 2011-01-02 ENCOUNTER — Telehealth: Payer: Self-pay | Admitting: Cardiovascular Disease

## 2011-01-02 NOTE — Telephone Encounter (Signed)
Pt Signed ROI for Records to be sent to Dr.Sam Hassan's Office, Records were faxed to [phone]  01/02/11/km

## 2011-01-03 ENCOUNTER — Other Ambulatory Visit: Payer: Self-pay

## 2011-01-03 MED ORDER — LISINOPRIL 10 MG PO TABS: 10.0000 mg | ORAL_TABLET | Freq: Every day | ORAL | Status: DC

## 2011-01-03 NOTE — Telephone Encounter (Signed)
..   Requested Prescriptions   Signed Prescriptions Disp Refills  . lisinopril (PRINIVIL,ZESTRIL) 10 MG tablet 90 tablet 4    Sig: Take 1 tablet (10 mg total) by mouth daily.    Authorizing Provider: Administrator, sports, [place]    Ordering User: Lacie Scotts   E-scribe to [employer].

## 2011-04-10 ENCOUNTER — Telehealth: Payer: Self-pay | Admitting: Cardiovascular Disease

## 2011-04-10 DIAGNOSIS — I10 Essential (primary) hypertension: Secondary | ICD-10-CM

## 2011-04-10 NOTE — Telephone Encounter (Signed)
Recommend increase lisinopril to 20 mg daily and check BMET in 2 weeks.

## 2011-04-10 NOTE — Telephone Encounter (Signed)
New problem Pt said his BP has been 156/90,153/86 He wants to talk to you about it

## 2011-04-10 NOTE — Telephone Encounter (Signed)
Tim Edwards is reporting an elevated bp on Sat and Sun before playing his normal tennis game.  He denies pain but does have sob which might be slightly worse recently (smoker).  BP yesterday was 156/90 and 153/83 in the am (didn't take it in the pm).  Saturday was about the same he said.  He felt like his heart was racing yesterday but heart rate was 70.  He did not take his bp today.  He states he takes his lisinopril and Metoprolol daily without missing in the evening.

## 2011-04-11 MED ORDER — LISINOPRIL 20 MG PO TABS: 20.0000 mg | ORAL_TABLET | Freq: Every day | ORAL | Status: DC

## 2011-04-11 NOTE — Telephone Encounter (Signed)
I spoke with the Tim Edwards and made him aware of Dr Earmon Phoenix recommendation.  New Rx sent to pharmacy.  The Tim Edwards will have BMP rechecked on 04/26/11.

## 2011-04-26 ENCOUNTER — Other Ambulatory Visit (INDEPENDENT_AMBULATORY_CARE_PROVIDER_SITE_OTHER): Payer: BC Managed Care – PPO

## 2011-04-26 DIAGNOSIS — I10 Essential (primary) hypertension: Secondary | ICD-10-CM

## 2011-04-26 LAB — BASIC METABOLIC PANEL
CO2: 28 mEq/L (ref 19–32)
Chloride: 104 mEq/L (ref 96–112)
Sodium: 141 mEq/L (ref 135–145)

## 2011-05-01 ENCOUNTER — Telehealth: Payer: Self-pay | Admitting: Cardiovascular Disease

## 2011-05-01 NOTE — Telephone Encounter (Signed)
Pt would like lab results from last week. °

## 2011-05-01 NOTE — Telephone Encounter (Signed)
Patient called was told bmet results 04/26/11.Patient stated he was not fasting.Copy of lab faxed to PCP Dr Roseanne Reno.Fax # [phone].

## 2011-05-23 ENCOUNTER — Other Ambulatory Visit: Payer: Self-pay | Admitting: Physician Assistant

## 2011-05-24 ENCOUNTER — Other Ambulatory Visit: Payer: Self-pay | Admitting: Physician Assistant

## 2011-05-24 MED ORDER — ATORVASTATIN CALCIUM 40 MG PO TABS: 40.0000 mg | ORAL_TABLET | Freq: Every day | ORAL | Status: DC

## 2011-06-02 ENCOUNTER — Encounter: Payer: Self-pay | Admitting: Cardiovascular Disease

## 2011-06-02 ENCOUNTER — Ambulatory Visit (INDEPENDENT_AMBULATORY_CARE_PROVIDER_SITE_OTHER): Payer: BC Managed Care – PPO | Admitting: Cardiovascular Disease

## 2011-06-02 VITALS — BP 133/86 | HR 61 | Ht 74.0 in | Wt 238.0 lb

## 2011-06-02 DIAGNOSIS — I2510 Atherosclerotic heart disease of native coronary artery without angina pectoris: Secondary | ICD-10-CM

## 2011-06-02 DIAGNOSIS — E7800 Pure hypercholesterolemia, unspecified: Secondary | ICD-10-CM

## 2011-06-02 MED ORDER — VARENICLINE TARTRATE 1 MG PO TABS: 1.0000 mg | ORAL_TABLET | Freq: Two times a day (BID) | ORAL | Status: AC

## 2011-06-02 MED ORDER — VARENICLINE TARTRATE 0.5 MG X 11 & 1 MG X 42 PO MISC: ORAL | Status: AC

## 2011-06-02 NOTE — Patient Instructions (Addendum)
Your physician wants you to follow-up in: 1 YEAR with Dr Excell Seltzer.  You will receive a reminder letter in the mail two months in advance. If you don't receive a letter, please call our office to schedule the follow-up appointment.  Your physician recommends that you return for a FASTING LIPID and LIVER in 3 MONTHS (08/30/11)-- nothing to eat or drink after midnight, lab opens at 8:30  Your physician has recommended you make the following change in your medication: DECREASE Aspirin to 81mg  daily  You can stop Plavix 1 month after completing 12 weeks of Chantix.  Will give prescription for Chantix.

## 2011-06-15 NOTE — Progress Notes (Signed)
HPI:  This is a 46 year old gentleman presenting for followup evaluation. He has coronary artery disease after presenting with acute coronary syndrome in 2010. He was treated with a sinus drug-eluting stent to the LAD. 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 The patient's left ventricular ejection fraction was 55%. He had an exercise treadmill done last year where he had excellent exercise tolerance and with 13 METS he had no symptoms or EKG changes.  He presents today for followup evaluation. He feels well. 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 He continues to smoke. He denies chest pain, chest pressure, dyspnea, edema, palpitations, lightheadedness, or syncope.  Outpatient Encounter Prescriptions as of 06/02/2011  Medication Sig Dispense Refill  . aspirin EC 81 MG EC tablet Take 1 tablet (81 mg total) by mouth daily.  1 tablet    . atorvastatin (LIPITOR) 40 MG tablet Take 1 tablet (40 mg total) by mouth daily.  30 tablet  2  . clopidogrel (PLAVIX) 75 MG tablet Take 1 tablet (75 mg total) by mouth daily.  90 tablet  3  . lisinopril (PRINIVIL,ZESTRIL) 20 MG tablet Take 1 tablet (20 mg total) by mouth daily.  90 tablet  3  . metoprolol succinate (TOPROL-XL) 25 MG 24 hr tablet Take 1 tablet (25 mg total) by mouth daily.  90 tablet  3  . DISCONTD: aspirin EC 325 MG EC tablet Take 325 mg by mouth daily.        . varenicline (CHANTIX CONTINUING MONTH PAK) 1 MG tablet Take 1 tablet (1 mg total) by mouth 2 (two) times daily.  60 tablet  2  . varenicline (CHANTIX STARTING MONTH PAK) 0.5 MG X 11 & 1 MG X 42 tablet Take one 0.5 mg tablet by mouth once daily for 3 days, then increase to one 0.5 mg tablet twice daily for 4 days, then increase to one 1 mg tablet twice daily.  53 tablet  0    No Known Allergies  Past Medical History  Diagnosis Date  . CAD (coronary artery disease)     a. s/p Xience DES to LAD 4/10;  b. cath 05/28/08: pLAD 90% (tx with DES); left  PDA occluded, RCA ok; EF 55%  . Hypertension   . Dyslipidemia     ROS: Negative except as per HPI  BP 133/86  Pulse 61  Ht 6\' 2"  (1.88 m)  Wt 107.956 kg (238 lb)  BMI 30.56 kg/m2  PHYSICAL EXAM: Pt is alert and oriented, NAD HEENT: normal Neck: JVP - normal, carotids 2+= without bruits Lungs: CTA bilaterally CV: RRR without murmur or gallop Abd: soft, NT, Positive BS, no hepatomegaly Ext: no C/C/E, distal pulses intact and equal Skin: warm/dry no rash  EKG:  Normal sinus rhythm 68 beats per minute, right atrial enlargement, borderline EKG.  ASSESSMENT AND PLAN: 1. Coronary artery disease. The patient remained stable with no symptoms. His exercise tolerance test last year was reviewed and there were no problems identified. I have recommended he reduce his aspirin dose to 81 mg daily. He will discontinue Plavix, but I've asked him to wait until he completes 12 weeks of Chantix (see below). He otherwise will continue on his same medical program.  2. Dyslipidemia. The patient is on Lipitor 40 mg daily. Lipids in May 2012 were reviewed and his total cholesterol is 183, HDL 39, and LDL 117. Followup lipids and LFTs will be scheduled.   3. Hypertensions. Reasonable control  on a combination of lisinopril and metoprolol.   4. Tobacco. Cessation was discussed at length. He will be tried on Chantix and a prescription was written. We discussed the small numerical increase in cardiovascular events but I feel that the benefit of tobacco cessation outweighs the small risk and he understands that.  5. Followup. I would like to see the patient back in 12 months.  Tonny Bollman 06/15/2011 1:06 PM

## 2011-07-19 ENCOUNTER — Other Ambulatory Visit: Payer: Self-pay | Admitting: Cardiovascular Disease

## 2011-07-19 NOTE — Telephone Encounter (Signed)
Fax Received. Refill Completed. Tim Edwards (R.M.A)   

## 2011-08-19 ENCOUNTER — Other Ambulatory Visit: Payer: Self-pay | Admitting: Cardiovascular Disease

## 2011-08-30 ENCOUNTER — Other Ambulatory Visit: Payer: BC Managed Care – PPO

## 2011-08-31 ENCOUNTER — Telehealth: Payer: Self-pay | Admitting: Cardiovascular Disease

## 2011-08-31 NOTE — Telephone Encounter (Signed)
New msg Pt wants to talk to you about his meds. Please call 

## 2011-08-31 NOTE — Telephone Encounter (Signed)
I spoke with the pt and he did not complete his course of Chantix.  The pt said he has already stopped his Plavix and he needs to make sure that we do not continue to refill this medication.  I made the pt aware that I will remove this from his medication list.

## 2011-10-26 ENCOUNTER — Ambulatory Visit (HOSPITAL_COMMUNITY)
Admission: RE | Admit: 2011-10-26 | Discharge: 2011-10-26 | Disposition: A | Discharge status: Home / Self Care | Payer: BC Managed Care – PPO | Source: Ambulatory Visit | Attending: Internal Medicine | Admitting: Internal Medicine

## 2011-10-26 ENCOUNTER — Other Ambulatory Visit (HOSPITAL_COMMUNITY): Payer: Self-pay | Admitting: Internal Medicine

## 2011-10-26 DIAGNOSIS — M545 Low back pain, unspecified: Secondary | ICD-10-CM

## 2011-10-26 DIAGNOSIS — M549 Dorsalgia, unspecified: Secondary | ICD-10-CM | POA: Insufficient documentation

## 2011-12-08 ENCOUNTER — Telehealth: Payer: Self-pay | Admitting: Cardiovascular Disease

## 2011-12-08 NOTE — Telephone Encounter (Signed)
I spoke with the pt and he wanted to know if he is feeling bad in general can he just take another lisinopril.  I made the pt aware that this is not recommended.  The pt said he checked his BP today and it was 144/80.  I instructed the pt to start checking his BP a few times a week.  If the pt's BP is consistently above 140/90 the pt should contact our office for medication adjustments.  Pt agreed with plan.

## 2011-12-08 NOTE — Telephone Encounter (Signed)
plz return call to pt [phone] regarding questions about medical care.

## 2011-12-12 ENCOUNTER — Telehealth: Payer: Self-pay | Admitting: Cardiovascular Disease

## 2011-12-12 DIAGNOSIS — I10 Essential (primary) hypertension: Secondary | ICD-10-CM

## 2011-12-12 NOTE — Telephone Encounter (Signed)
Add hctz 25 mg daily and check BMET 2 weeks

## 2011-12-12 NOTE — Telephone Encounter (Signed)
plz return call to pt [phone] regarding pt questions about previous surgery.

## 2011-12-12 NOTE — Telephone Encounter (Signed)
I spoke with the pt and answered his questions about his cardiac cath from 2010 (needed information for insurance).  The pt also mentioned that his BP has been elevated.  The pt's BP this weekend was 188/96. On average the pt's BP is running 150/80.  The pt would like Dr Excell Seltzer to make further recommendations for treating his BP. I made the pt aware that Dr Excell Seltzer is currently out of the office this week.  The pt would like to wait for Dr Cooper's response.

## 2011-12-14 MED ORDER — HYDROCHLOROTHIAZIDE 25 MG PO TABS: 25.0000 mg | ORAL_TABLET | Freq: Every day | ORAL | Status: DC

## 2011-12-14 NOTE — Telephone Encounter (Signed)
I spoke with the pt and made him aware that he can start HCTZ 25mg  daily in addition to his other medications.  The pt will come into the office in 2 weeks for a repeat BMP. Rx sent to pharmacy.

## 2011-12-28 ENCOUNTER — Other Ambulatory Visit: Payer: BC Managed Care – PPO

## 2012-01-01 ENCOUNTER — Other Ambulatory Visit (INDEPENDENT_AMBULATORY_CARE_PROVIDER_SITE_OTHER): Payer: BC Managed Care – PPO

## 2012-01-01 DIAGNOSIS — I10 Essential (primary) hypertension: Secondary | ICD-10-CM

## 2012-01-01 LAB — BASIC METABOLIC PANEL
BUN: 20 mg/dL (ref 6–23)
CO2: 30 mEq/L (ref 19–32)
Chloride: 103 mEq/L (ref 96–112)
Glucose, Bld: 107 mg/dL — ABNORMAL HIGH (ref 70–99)
Potassium: 4.5 mEq/L (ref 3.5–5.1)

## 2012-03-29 ENCOUNTER — Other Ambulatory Visit: Payer: Self-pay | Admitting: Cardiovascular Disease

## 2012-05-14 ENCOUNTER — Other Ambulatory Visit: Payer: Self-pay | Admitting: Physician Assistant

## 2012-05-17 ENCOUNTER — Other Ambulatory Visit: Payer: Self-pay | Admitting: Cardiovascular Disease

## 2012-06-07 ENCOUNTER — Encounter: Payer: Self-pay | Admitting: Cardiovascular Disease

## 2012-06-07 ENCOUNTER — Ambulatory Visit (INDEPENDENT_AMBULATORY_CARE_PROVIDER_SITE_OTHER): Payer: BC Managed Care – PPO | Admitting: Cardiovascular Disease

## 2012-06-07 VITALS — BP 108/76 | HR 51 | Ht 74.0 in | Wt 231.0 lb

## 2012-06-07 DIAGNOSIS — E7800 Pure hypercholesterolemia, unspecified: Secondary | ICD-10-CM

## 2012-06-07 DIAGNOSIS — I10 Essential (primary) hypertension: Secondary | ICD-10-CM

## 2012-06-07 DIAGNOSIS — I2510 Atherosclerotic heart disease of native coronary artery without angina pectoris: Secondary | ICD-10-CM

## 2012-06-07 NOTE — Progress Notes (Signed)
   HPI:  47 year old gentleman presenting for followup evaluation. He has coronary artery disease after presenting with acute coronary syndrome in 2010. He was treated with a sinus drug-eluting stent to the LAD. 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 The patient's left ventricular ejection fraction was 55%.  He's doing well. He complains of some burning in the left chest over the last week, but this is occurred only at rest. He's played tennis and worked out very hard without any exertional symptoms. He denies dyspnea, exertional chest pain or pressure, edema, orthopnea, PND, or palpitations. He feels well. He continues to smoke cigarettes. I prescribed Chantix last year but this did not help him quit.  Outpatient Encounter Prescriptions as of 06/07/2012  Medication Sig Dispense Refill  . aspirin EC 81 MG EC tablet Take 1 tablet (81 mg total) by mouth daily.  1 tablet    . atorvastatin (LIPITOR) 40 MG tablet TAKE ONE TABLET BY MOUTH ONE TIME DAILY  30 tablet  2  . hydrochlorothiazide (HYDRODIURIL) 25 MG tablet Take 1 tablet (25 mg total) by mouth daily.  90 tablet  3  . lisinopril (PRINIVIL,ZESTRIL) 20 MG tablet TAKE ONE TABLET BY MOUTH ONE TIME DAILY  90 tablet  1  . metoprolol succinate (TOPROL-XL) 25 MG 24 hr tablet TAKE ONE TABLET BY MOUTH ONE TIME DAILY  30 tablet  1  . [DISCONTINUED] atorvastatin (LIPITOR) 40 MG tablet Take 1 tablet (40 mg total) by mouth daily.  30 tablet  2   No facility-administered encounter medications on file as of 06/07/2012.    No Known Allergies  Past Medical History  Diagnosis Date  . CAD (coronary artery disease)     a. s/p Xience DES to LAD 4/10;  b. cath 05/28/08: pLAD 90% (tx with DES); left PDA occluded, RCA ok; EF 55%  . Hypertension   . Dyslipidemia     ROS: Negative except as per HPI  BP 108/76  Pulse 51  Ht 6\' 2"  (1.88 m)  Wt 231 lb (104.781 kg)  BMI 29.65 kg/m2  SpO2 99%  PHYSICAL EXAM: Pt is alert and  oriented, NAD HEENT: normal Neck: JVP - normal, carotids 2+= without bruits Lungs: CTA bilaterally CV: RRR without murmur or gallop Abd: soft, NT, Positive BS, no hepatomegaly Ext: no C/C/E, distal pulses intact and equal Skin: warm/dry no rash  EKG:  Sinus bradycardia 51 beats per minute, within normal limits.  ASSESSMENT AND PLAN: 1. Coronary artery disease, native vessel. The patient is stable without anginal symptoms. His resting episodes of chest burning have been fleeting and I do not think they are cardiac in nature. We discussed consideration of a treadmill study but he did not want to do this. Will continue his current medical program without changes. His medications were reviewed and they are appropriate.  2. Essential hypertension. Will continue lisinopril, Toprol-XL, and hydrochlorothiazide. His blood pressure is well controlled.  3. Hyperlipidemia. He is on atorvastatin 40 mg daily and lipids and LFTs will be drawn today.  I'll see him back in 2 years for followup.  Tonny Bollman 06/07/2012 5:41 PM

## 2012-06-07 NOTE — Patient Instructions (Addendum)
Your physician recommends that you have a FASTING LIPID and LIVER profile today.   Your physician wants you to follow-up in: 2 YEARS with Dr Excell Seltzer.  You will receive a reminder letter in the mail two months in advance. If you don't receive a letter, please call our office to schedule the follow-up appointment.  Your physician recommends that you continue on your current medications as directed. Please refer to the Current Medication list given to you today.

## 2012-06-10 LAB — HEPATIC FUNCTION PANEL
ALT: 27 U/L (ref 0–53)
AST: 29 U/L (ref 0–37)
Albumin: 4.4 g/dL (ref 3.5–5.2)
Alkaline Phosphatase: 48 U/L (ref 39–117)
Bilirubin, Direct: 0.1 mg/dL (ref 0.0–0.3)
Total Protein: 7.1 g/dL (ref 6.0–8.3)

## 2012-06-10 LAB — LIPID PANEL
Cholesterol: 139 mg/dL (ref 0–200)
LDL Cholesterol: 86 mg/dL (ref 0–99)
Triglycerides: 114 mg/dL (ref 0.0–149.0)

## 2012-07-29 ENCOUNTER — Other Ambulatory Visit: Payer: Self-pay | Admitting: *Deleted

## 2012-07-29 MED ORDER — METOPROLOL SUCCINATE ER 25 MG PO TB24: 25.0000 mg | ORAL_TABLET | Freq: Every day | ORAL | Status: DC

## 2012-07-29 MED ORDER — ATORVASTATIN CALCIUM 40 MG PO TABS: ORAL_TABLET | ORAL | Status: DC

## 2012-09-20 ENCOUNTER — Other Ambulatory Visit: Payer: Self-pay | Admitting: Cardiovascular Disease

## 2012-12-04 ENCOUNTER — Other Ambulatory Visit: Payer: Self-pay | Admitting: Cardiovascular Disease

## 2013-04-09 ENCOUNTER — Ambulatory Visit (HOSPITAL_COMMUNITY)
Admission: RE | Admit: 2013-04-09 | Discharge: 2013-04-09 | Disposition: A | Discharge status: Home / Self Care | Payer: BC Managed Care – PPO | Source: Ambulatory Visit | Attending: Internal Medicine | Admitting: Internal Medicine

## 2013-04-09 ENCOUNTER — Other Ambulatory Visit (HOSPITAL_COMMUNITY): Payer: Self-pay | Admitting: Internal Medicine

## 2013-04-09 DIAGNOSIS — R0602 Shortness of breath: Secondary | ICD-10-CM

## 2013-04-15 ENCOUNTER — Encounter: Payer: Self-pay | Admitting: Internal Medicine

## 2013-06-26 ENCOUNTER — Encounter: Payer: BC Managed Care – PPO | Admitting: Internal Medicine

## 2013-07-22 ENCOUNTER — Other Ambulatory Visit: Payer: Self-pay | Admitting: Cardiovascular Disease

## 2013-08-06 ENCOUNTER — Ambulatory Visit (AMBULATORY_SURGERY_CENTER): Payer: Self-pay | Admitting: *Deleted

## 2013-08-06 VITALS — Ht 73.5 in | Wt 247.0 lb

## 2013-08-06 DIAGNOSIS — Z800 Family history of malignant neoplasm of digestive organs: Secondary | ICD-10-CM

## 2013-08-06 MED ORDER — MOVIPREP 100 G PO SOLR: ORAL | Status: DC

## 2013-08-06 MED ORDER — NA SULFATE-K SULFATE-MG SULF 17.5-3.13-1.6 GM/177ML PO SOLN: ORAL | Status: DC

## 2013-08-06 NOTE — Progress Notes (Signed)
No egg or soy allergies  Pt denies trouble with anesthesia or being told he is difficult to intubate  No diet medications taken  Registered in [facility]

## 2013-08-06 NOTE — Addendum Note (Signed)
Addended by: Randall Hiss B on: 08/06/2013 02:48 PM   Modules accepted: Orders, Medications

## 2013-08-07 ENCOUNTER — Encounter: Payer: Self-pay | Admitting: Internal Medicine

## 2013-08-27 ENCOUNTER — Ambulatory Visit (AMBULATORY_SURGERY_CENTER): Payer: BC Managed Care – PPO | Admitting: Internal Medicine

## 2013-08-27 ENCOUNTER — Encounter: Payer: Self-pay | Admitting: Internal Medicine

## 2013-08-27 VITALS — BP 121/81 | HR 65 | Temp 97.9°F | Resp 11 | Ht 73.0 in | Wt 247.0 lb

## 2013-08-27 DIAGNOSIS — D126 Benign neoplasm of colon, unspecified: Secondary | ICD-10-CM

## 2013-08-27 DIAGNOSIS — Z1211 Encounter for screening for malignant neoplasm of colon: Secondary | ICD-10-CM

## 2013-08-27 DIAGNOSIS — K5730 Diverticulosis of large intestine without perforation or abscess without bleeding: Secondary | ICD-10-CM

## 2013-08-27 DIAGNOSIS — Z800 Family history of malignant neoplasm of digestive organs: Secondary | ICD-10-CM

## 2013-08-27 DIAGNOSIS — Z860100 Personal history of colon polyps, unspecified: Secondary | ICD-10-CM

## 2013-08-27 HISTORY — DX: Personal history of colon polyps, unspecified: Z86.0100

## 2013-08-27 HISTORY — DX: Personal history of colonic polyps: Z86.010

## 2013-08-27 MED ORDER — SODIUM CHLORIDE 0.9 % IV SOLN: 500.0000 mL | INTRAVENOUS | Status: DC

## 2013-08-27 NOTE — Op Note (Signed)
[place]  [address] [place], [address]   COLONOSCOPY PROCEDURE REPORT  PATIENT: Tim Edwards, Tim Edwards  MR#: [id number] BIRTHDATE: 1965/04/26 , 48  yrs. old GENDER: Male ENDOSCOPIST: Gatha Mayer, MD, [facility] REFERRED QB:VQXI Sheryle Hail, M.D. PROCEDURE DATE:  08/27/2013 PROCEDURE:   Colonoscopy with biopsy and snare polypectomy First Screening Colonoscopy - Avg.  risk and is 50 yrs.  old or older - No.  Prior Negative Screening - Now for repeat screening. 10 or more years since last screening  History of Adenoma - Now for follow-up colonoscopy & has been > or = to 3 yrs.  N/A  Polyps Removed Today? Yes. ASA CLASS:   Class III INDICATIONS:elevated risk screening and Patient's immediate family history of colon cancer. MEDICATIONS: propofol (Diprivan) 250mg  IV, MAC sedation, administered by CRNA, and These medications were titrated to patient response per physician's verbal order  DESCRIPTION OF PROCEDURE:   After the risks benefits and alternatives of the procedure were thoroughly explained, informed consent was obtained.  A digital rectal exam revealed no abnormalities of the rectum, A digital rectal exam revealed no prostatic nodules, and A digital rectal exam revealed the prostate was not enlarged.   The [id number]  endoscope was introduced through the anus and advanced to the cecum, which was identified by both the appendix and ileocecal valve. No adverse events experienced.   The quality of the prep was excellent using Suprep  The instrument was then slowly withdrawn as the colon was fully examined.  COLON FINDINGS: Three sessile polyps measuring 12, 5 and 3 mm in size were found at the cecum and in the descending colon.  A polypectomy was performed with a cold snare and with cold forceps. The resection was complete and the polyp tissue was completely retrieved.   Moderate diverticulosis was noted in the sigmoid colon.   The colon mucosa was  otherwise normal.   A right colon retroflexion was performed.  Retroflexed views revealed no abnormalities. The time to cecum=1 minutes 05 seconds.  Withdrawal time=11 minutes 35 seconds.  The scope was withdrawn and the procedure completed. COMPLICATIONS: There were no complications.  ENDOSCOPIC IMPRESSION: 1.   Three sessile polyps measuring 12, 5 and 3 mm in size were found at the cecum and in the descending colon; polypectomy was performed with a cold snare and with cold forceps 2.   Moderate diverticulosis was noted in the sigmoid colon 3.   The colon mucosa was otherwise normal  RECOMMENDATIONS: Timing of repeat colonoscopy will be determined by pathology findings.   eSigned:  Gatha Mayer, MD, [facility] 08/27/2013 8:35 AM cc: Marye Round MD and The Patient

## 2013-08-27 NOTE — Patient Instructions (Addendum)
I found and removed 3 polyps that look benign.  You also have a condition called diverticulosis - common and not usually a problem. Please read the handout provided.  I will let you know pathology results and when to have another routine colonoscopy by mail.  I appreciate the opportunity to care for you. Gatha Mayer, MD, FACG YOU HAD AN ENDOSCOPIC PROCEDURE TODAY AT [facility]: Refer to the procedure report that was given to you for any specific questions about what was found during the examination.  If the procedure report does not answer your questions, please call your gastroenterologist to clarify.  If you requested that your care partner not be given the details of your procedure findings, then the procedure report has been included in a sealed envelope for you to review at your convenience later.  YOU SHOULD EXPECT: Some feelings of bloating in the abdomen. Passage of more gas than usual.  Walking can help get rid of the air that was put into your GI tract during the procedure and reduce the bloating. If you had a lower endoscopy (such as a colonoscopy or flexible sigmoidoscopy) you may notice spotting of blood in your stool or on the toilet paper. If you underwent a bowel prep for your procedure, then you may not have a normal bowel movement for a few days.  DIET: Your first meal following the procedure should be a light meal and then it is ok to progress to your normal diet.  A half-sandwich or bowl of soup is an example of a good first meal.  Heavy or fried foods are harder to digest and may make you feel nauseous or bloated.  Likewise meals heavy in dairy and vegetables can cause extra gas to form and this can also increase the bloating.  Drink plenty of fluids but you should avoid alcoholic beverages for 24 hours.  ACTIVITY: Your care partner should take you home directly after the procedure.  You should plan to take it easy, moving slowly for the rest of the day.  You  can resume normal activity the day after the procedure however you should NOT DRIVE or use heavy machinery for 24 hours (because of the sedation medicines used during the test).    SYMPTOMS TO REPORT IMMEDIATELY: A gastroenterologist can be reached at any hour.  During normal business hours, 8:30 AM to 5:00 PM Monday through Friday, call [phone].  After hours and on weekends, please call the GI answering service at [phone] who will take a message and have the physician on call contact you.   Following lower endoscopy (colonoscopy or flexible sigmoidoscopy):  Excessive amounts of blood in the stool  Significant tenderness or worsening of abdominal pains  Swelling of the abdomen that is new, acute  Fever of 100F or higher    FOLLOW UP: If any biopsies were taken you will be contacted by phone or by letter within the next 1-3 weeks.  Call your gastroenterologist if you have not heard about the biopsies in 3 weeks.  Our staff will call the home number listed on your records the next business day following your procedure to check on you and address any questions or concerns that you may have at that time regarding the information given to you following your procedure. This is a courtesy call and so if there is no answer at the home number and we have not heard from you through the emergency physician on call, we will  assume that you have returned to your regular daily activities without incident.  SIGNATURES/CONFIDENTIALITY: You and/or your care partner have signed paperwork which will be entered into your electronic medical record.  These signatures attest to the fact that that the information above on your After Visit Summary has been reviewed and is understood.  Full responsibility of the confidentiality of this discharge information lies with you and/or your care-partner.  Polyp and diverticulosis information given.

## 2013-08-27 NOTE — Progress Notes (Signed)
Called to room to assist during endoscopic procedure.  Patient ID and intended procedure confirmed with present staff. Received instructions for my participation in the procedure from the performing physician.  

## 2013-08-27 NOTE — Progress Notes (Signed)
Patient awake and alert, report to rn

## 2013-08-28 ENCOUNTER — Telehealth: Payer: Self-pay | Admitting: *Deleted

## 2013-08-28 NOTE — Telephone Encounter (Signed)
  Follow up Call-  Call back number 08/27/2013  Post procedure Call Back phone  # [phone]  Permission to leave phone message Yes     Patient questions:  Phone kept ringing and then disconnected.

## 2013-09-03 ENCOUNTER — Encounter: Payer: Self-pay | Admitting: Internal Medicine

## 2013-10-05 ENCOUNTER — Other Ambulatory Visit: Payer: Self-pay | Admitting: Cardiovascular Disease

## 2013-11-30 ENCOUNTER — Other Ambulatory Visit: Payer: Self-pay | Admitting: Cardiovascular Disease

## 2014-03-04 ENCOUNTER — Encounter: Payer: Self-pay | Admitting: Physician Assistant

## 2014-06-04 ENCOUNTER — Other Ambulatory Visit: Payer: Self-pay | Admitting: Cardiovascular Disease

## 2014-06-24 ENCOUNTER — Encounter: Payer: Self-pay | Admitting: Cardiovascular Disease

## 2014-07-03 ENCOUNTER — Ambulatory Visit: Payer: Self-pay | Admitting: Cardiovascular Disease

## 2014-07-05 ENCOUNTER — Other Ambulatory Visit (HOSPITAL_COMMUNITY): Payer: Self-pay | Admitting: Cardiovascular Disease

## 2014-07-27 ENCOUNTER — Encounter: Payer: Self-pay | Admitting: Cardiovascular Disease

## 2014-07-27 NOTE — Telephone Encounter (Signed)
New Message        Pt calling wanting to speak to Lauren, pt states he only wants to see Dr. Burt Knack and doesn't want to see a PA. Please call back and advise.

## 2014-07-27 NOTE — Telephone Encounter (Signed)
This encounter was created in error - please disregard.

## 2014-07-29 ENCOUNTER — Ambulatory Visit: Payer: Self-pay | Admitting: Physician Assistant

## 2014-07-30 ENCOUNTER — Ambulatory Visit (INDEPENDENT_AMBULATORY_CARE_PROVIDER_SITE_OTHER): Payer: BLUE CROSS/BLUE SHIELD | Admitting: Cardiovascular Disease

## 2014-07-30 ENCOUNTER — Encounter: Payer: Self-pay | Admitting: Cardiovascular Disease

## 2014-07-30 VITALS — BP 115/72 | HR 56 | Ht 73.0 in | Wt 241.1 lb

## 2014-07-30 DIAGNOSIS — E785 Hyperlipidemia, unspecified: Secondary | ICD-10-CM

## 2014-07-30 DIAGNOSIS — I2510 Atherosclerotic heart disease of native coronary artery without angina pectoris: Secondary | ICD-10-CM

## 2014-07-30 DIAGNOSIS — I10 Essential (primary) hypertension: Secondary | ICD-10-CM | POA: Diagnosis not present

## 2014-07-30 LAB — HEPATIC FUNCTION PANEL
ALK PHOS: 51 U/L (ref 39–117)
ALT: 36 U/L (ref 0–53)
AST: 27 U/L (ref 0–37)
Albumin: 4.5 g/dL (ref 3.5–5.2)
BILIRUBIN DIRECT: 0.1 mg/dL (ref 0.0–0.3)
Total Bilirubin: 0.8 mg/dL (ref 0.2–1.2)
Total Protein: 7.2 g/dL (ref 6.0–8.3)

## 2014-07-30 LAB — LIPID PANEL
CHOLESTEROL: 163 mg/dL (ref 0–200)
HDL: 37.9 mg/dL — ABNORMAL LOW (ref >39.00)
LDL Cholesterol: 108 mg/dL — ABNORMAL HIGH (ref 0–99)
NonHDL: 125.1
Total CHOL/HDL Ratio: 4
Triglycerides: 84 mg/dL (ref 0.0–149.0)
VLDL: 16.8 mg/dL (ref 0.0–40.0)

## 2014-07-30 LAB — BASIC METABOLIC PANEL
BUN: 25 mg/dL — ABNORMAL HIGH (ref 6–23)
CALCIUM: 9.2 mg/dL (ref 8.4–10.5)
CHLORIDE: 103 meq/L (ref 96–112)
CO2: 28 meq/L (ref 19–32)
CREATININE: 1.45 mg/dL (ref 0.40–1.50)
GFR: 54.51 mL/min — ABNORMAL LOW (ref >60.00)
GLUCOSE: 106 mg/dL — AB (ref 70–99)
Potassium: 4.2 mEq/L (ref 3.5–5.1)
SODIUM: 136 meq/L (ref 135–145)

## 2014-07-30 NOTE — Patient Instructions (Signed)
Medication Instructions:  Your physician recommends that you continue on your current medications as directed. Please refer to the Current Medication list given to you today.  Labwork: Your physician recommends that you have lab work today: LIPID, LIVER and BMP   Testing/Procedures: No new orders.   Follow-Up: Your physician wants you to follow-up in: 2 YEAR with Dr Burt Knack.  You will receive a reminder letter in the mail two months in advance. If you don't receive a letter, please call our office to schedule the follow-up appointment.   Any Other Special Instructions Will Be Listed Below (If Applicable).

## 2014-07-30 NOTE — Progress Notes (Signed)
Cardiology Office Note Date:  07/31/2014   ID:  Tim Edwards, DOB 1965-09-30, MRN [id number]  PCP:  Antonietta Jewel, MD  Cardiologist:  Sherren Mocha, MD    Chief Complaint  Patient presents with  . Coronary Artery Disease   History of Present Illness: Tim Edwards is a 49 y.o. male who presents for follow-up of CAD.   He has coronary artery disease after presenting with acute coronary syndrome in 2010. He was treated with a  drug-eluting stent to the LAD. 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 The patient's left ventricular ejection fraction was 55%.  The patient feels well. He denies CP, dyspnea, edema, or heart palpitations. Continues to smoke about 5 cigarettes/day. Exercises regularly - plays tennis - no exertional symptoms.  Past Medical History  Diagnosis Date  . CAD (coronary artery disease)     a. s/p Xience DES to LAD 4/10;  b. cath 05/28/08: pLAD 90% (tx with DES); left PDA occluded, RCA ok; EF 55%  . Hypertension   . Dyslipidemia   . Hyperlipidemia   . Personal history of colonic polyps - tubular adenomas 08/27/2013    Past Surgical History  Procedure Laterality Date  . Vasectomy    . Coronary angioplasty with stent placement    . Colonoscopy      Current Outpatient Prescriptions  Medication Sig Dispense Refill  . aspirin EC 81 MG EC tablet Take 1 tablet (81 mg total) by mouth daily. 1 tablet   . hydrochlorothiazide (HYDRODIURIL) 25 MG tablet TAKE ONE TABLET BY MOUTH ONE TIME DAILY 90 tablet 0  . lisinopril (PRINIVIL,ZESTRIL) 20 MG tablet TAKE ONE TABLET BY MOUTH ONE TIME DAILY 90 tablet 0  . OVER THE COUNTER MEDICATION OTC sleep aide- takes every noc    . atorvastatin (LIPITOR) 40 MG tablet TAKE ONE TABLET BY MOUTH ONE TIME DAILY 30 tablet 0  . metoprolol succinate (TOPROL-XL) 25 MG 24 hr tablet TAKE ONE TABLET BY MOUTH ONE TIME DAILY 30 tablet 0   No current facility-administered medications for this visit.     Allergies:   Review of patient's allergies indicates no known allergies.   Social History:  The patient  reports that he has been smoking Cigarettes.  He has a 20 pack-year smoking history. He has never used smokeless tobacco. He reports that he drinks alcohol. He reports that he does not use illicit drugs.   Family History:  The patient's  family history includes Cancer in his father; Colon cancer in his mother; Crohn's disease in his brother. There is no history of Esophageal cancer, Stomach cancer, or Rectal cancer.    ROS:  Please see the history of present illness.  All other systems are reviewed and negative.   PHYSICAL EXAM: VS:  BP 115/72 mmHg  Pulse 56  Ht 6\' 1"  (1.854 m)  Wt 241 lb 1.9 oz (109.371 kg)  BMI 31.82 kg/m2 , BMI Body mass index is 31.82 kg/(m^2). GEN: Well nourished, well developed, in no acute distress HEENT: normal Neck: no JVD, no masses. No carotid bruits Cardiac: RRR without murmur or gallop                Respiratory:  clear to auscultation bilaterally, normal work of breathing GI: soft, nontender, nondistended, + BS MS: no deformity or atrophy Ext: no pretibial edema, pedal pulses 2+= bilaterally Skin: warm and dry, no rash Neuro:  Strength and sensation are intact Psych: euthymic mood, full affect  EKG:  EKG is ordered today. The ekg ordered today shows sinus bradycardia 56 bpm, within normal limits  Recent Labs: 07/30/2014: ALT 36; BUN 25*; Creatinine, Ser 1.45; Potassium 4.2; Sodium 136   Lipid Panel     Component Value Date/Time   CHOL 163 07/30/2014 0835   TRIG 84.0 07/30/2014 0835   HDL 37.90* 07/30/2014 0835   CHOLHDL 4 07/30/2014 0835   VLDL 16.8 07/30/2014 0835   LDLCALC 108* 07/30/2014 0835      Wt Readings from Last 3 Encounters:  07/30/14 241 lb 1.9 oz (109.371 kg)  08/27/13 247 lb (112.038 kg)  08/06/13 247 lb (112.038 kg)    ASSESSMENT AND PLAN: 1.  CAD, native vessel, without angina: feels well without problems. meds  reviewed and will continue without changes.   2. Hyperlipidemia: check lipids and LFT's today. Continue high-intensity statin. Discussed weight loss and exercise.  3. HTN - continue current Rx. BP at goal. Check metabolic panel today.  4. Tobacco - cessation counseling done.  Current medicines are reviewed with the patient today.  The patient does not have concerns regarding medicines.  Labs/ tests ordered today include:   Orders Placed This Encounter  Procedures  . Lipid panel  . Hepatic function panel  . Basic Metabolic Panel (BMET)  . EKG 12-Lead   Disposition:   FU 2 years unless problems arise  Signed, Sherren Mocha, MD  07/31/2014 [facility] [place], [place], [place]  [address] Phone: [phone]; Fax: [phone]

## 2014-07-31 ENCOUNTER — Other Ambulatory Visit: Payer: Self-pay | Admitting: Cardiovascular Disease

## 2014-08-04 ENCOUNTER — Other Ambulatory Visit: Payer: Self-pay

## 2014-08-04 DIAGNOSIS — E785 Hyperlipidemia, unspecified: Secondary | ICD-10-CM

## 2014-08-04 MED ORDER — ATORVASTATIN CALCIUM 80 MG PO TABS: 80.0000 mg | ORAL_TABLET | Freq: Every day | ORAL | Status: DC

## 2014-08-04 MED ORDER — METOPROLOL SUCCINATE ER 25 MG PO TB24: 25.0000 mg | ORAL_TABLET | Freq: Every day | ORAL | Status: DC

## 2014-08-04 MED ORDER — LISINOPRIL 20 MG PO TABS: 20.0000 mg | ORAL_TABLET | Freq: Every day | ORAL | Status: DC

## 2014-08-04 MED ORDER — HYDROCHLOROTHIAZIDE 25 MG PO TABS: 25.0000 mg | ORAL_TABLET | Freq: Every day | ORAL | Status: DC

## 2014-08-28 ENCOUNTER — Other Ambulatory Visit: Payer: Self-pay | Admitting: *Deleted

## 2014-08-28 ENCOUNTER — Telehealth: Payer: Self-pay

## 2014-08-28 ENCOUNTER — Other Ambulatory Visit: Payer: Self-pay | Admitting: Cardiovascular Disease

## 2014-08-28 NOTE — Telephone Encounter (Signed)
Pharmacy requested atorvastatin 40mg  refills.  Patient is no longer on this strength.  Now takes 80mg  and he states he does not need refills at this time.  The 40mg  tablet atorvastatin has been taken off his list.

## 2014-09-02 NOTE — Telephone Encounter (Signed)
complete

## 2014-11-09 ENCOUNTER — Other Ambulatory Visit (INDEPENDENT_AMBULATORY_CARE_PROVIDER_SITE_OTHER): Payer: BLUE CROSS/BLUE SHIELD | Admitting: *Deleted

## 2014-11-09 DIAGNOSIS — E785 Hyperlipidemia, unspecified: Secondary | ICD-10-CM | POA: Diagnosis not present

## 2014-11-09 LAB — LIPID PANEL
CHOLESTEROL: 120 mg/dL (ref 0–200)
HDL: 34.5 mg/dL — ABNORMAL LOW (ref >39.00)
LDL Cholesterol: 71 mg/dL (ref 0–99)
NonHDL: 85.5
TRIGLYCERIDES: 71 mg/dL (ref 0.0–149.0)
Total CHOL/HDL Ratio: 3
VLDL: 14.2 mg/dL (ref 0.0–40.0)

## 2014-11-09 LAB — HEPATIC FUNCTION PANEL
ALK PHOS: 47 U/L (ref 39–117)
ALT: 30 U/L (ref 0–53)
AST: 27 U/L (ref 0–37)
Albumin: 4.2 g/dL (ref 3.5–5.2)
Bilirubin, Direct: 0.2 mg/dL (ref 0.0–0.3)
TOTAL PROTEIN: 6.7 g/dL (ref 6.0–8.3)
Total Bilirubin: 0.9 mg/dL (ref 0.2–1.2)

## 2015-03-17 ENCOUNTER — Encounter (HOSPITAL_BASED_OUTPATIENT_CLINIC_OR_DEPARTMENT_OTHER): Payer: Self-pay

## 2015-03-17 ENCOUNTER — Emergency Department (HOSPITAL_BASED_OUTPATIENT_CLINIC_OR_DEPARTMENT_OTHER): Payer: BLUE CROSS/BLUE SHIELD

## 2015-03-17 ENCOUNTER — Emergency Department (HOSPITAL_BASED_OUTPATIENT_CLINIC_OR_DEPARTMENT_OTHER)
Admission: EM | Admit: 2015-03-17 | Discharge: 2015-03-17 | Disposition: A | Discharge status: Home / Self Care | Payer: BLUE CROSS/BLUE SHIELD | Attending: Emergency Medicine | Admitting: Emergency Medicine

## 2015-03-17 DIAGNOSIS — I10 Essential (primary) hypertension: Secondary | ICD-10-CM | POA: Insufficient documentation

## 2015-03-17 DIAGNOSIS — Z79899 Other long term (current) drug therapy: Secondary | ICD-10-CM | POA: Insufficient documentation

## 2015-03-17 DIAGNOSIS — Z86010 Personal history of colonic polyps: Secondary | ICD-10-CM | POA: Insufficient documentation

## 2015-03-17 DIAGNOSIS — F17210 Nicotine dependence, cigarettes, uncomplicated: Secondary | ICD-10-CM | POA: Insufficient documentation

## 2015-03-17 DIAGNOSIS — Z7982 Long term (current) use of aspirin: Secondary | ICD-10-CM | POA: Insufficient documentation

## 2015-03-17 DIAGNOSIS — R51 Headache: Secondary | ICD-10-CM | POA: Diagnosis present

## 2015-03-17 DIAGNOSIS — I2510 Atherosclerotic heart disease of native coronary artery without angina pectoris: Secondary | ICD-10-CM | POA: Diagnosis not present

## 2015-03-17 DIAGNOSIS — E785 Hyperlipidemia, unspecified: Secondary | ICD-10-CM | POA: Diagnosis not present

## 2015-03-17 DIAGNOSIS — R519 Headache, unspecified: Secondary | ICD-10-CM

## 2015-03-17 MED ORDER — BUTALBITAL-APAP-CAFFEINE 50-325-40 MG PO TABS: 1.0000 | ORAL_TABLET | Freq: Four times a day (QID) | ORAL | Status: AC | PRN

## 2015-03-17 NOTE — ED Provider Notes (Signed)
CSN: [id number]     Arrival date & time 03/17/15  1553 History   First MD Initiated Contact with Patient 03/17/15 [phone]     Chief Complaint  Patient presents with  . Headache   HPI   50 year old male presents today with headache. Patient reports for the last 2 weeks he's had headaches that have waking him up from sleep. He reports that after several rales or sleeping he wakes up from sleep with a "throbbing" pain to the posterior aspect of his head. He reports getting up, trying over-the-counter medications with no acute resolution of symptoms. He reports approximately 3 hours later the symptoms resolved and he is able to go back to sleep. He notes that upon initial onset symptoms were present daily, then discontinued, and happening again last night. He states that he has no neurological deficits, fever, neck stiffness, radiation of symptoms, dizziness, weakness, or any other acute concerning signs or symptoms associated with the headaches. Patient notes that he takes a sleep aid before bed, uncertain as to the ingredients reports that it's called "sleep aid" and over-the-counter from the pharmacy. Patient denies any significant past medical history of headaches. He denies any trauma to the head, well-controlled hypertension, no pain to palpation of the posterior aspect of the head. He attempted taking for Advil before going to bed last night but this did not prevent the attack from occurring. Patient denies any upper respiratory complaints, recent change in living arrangements, health, or medication changes. He denies any fever, weight loss, night sweats. 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 Patient reports that he sees Dr. Sheryle Hail but has not informed him of current symptoms.   Past Medical History  Diagnosis Date  . CAD (coronary artery disease)     a. s/p Xience DES to LAD 4/10;  b. cath 05/28/08: pLAD 90% (tx with DES); left PDA occluded, RCA ok; EF 55%   . Hypertension   . Dyslipidemia   . Hyperlipidemia   . Personal history of colonic polyps - tubular adenomas 08/27/2013   Past Surgical History  Procedure Laterality Date  . Vasectomy    . Coronary angioplasty with stent placement    . Colonoscopy     Family History  Problem Relation Age of Onset  . Cancer Father   . Colon cancer Mother   . Crohn's disease Brother   . Esophageal cancer Neg Hx   . Stomach cancer Neg Hx   . Rectal cancer Neg Hx    Social History  Substance Use Topics  . Smoking status: Current Every Day Smoker -- 1.00 packs/day for 20 years    Types: Cigarettes  . Smokeless tobacco: Never Used  . Alcohol Use: Yes     Comment: socially    Review of Systems  All other systems reviewed and are negative.     Allergies  Review of patient's allergies indicates no known allergies.  Home Medications   Prior to Admission medications   Medication Sig Start Date End Date Taking? Authorizing Provider  TESTOSTERONE IM Inject into the muscle.   Yes Historical Provider, MD  aspirin EC 81 MG EC tablet Take 1 tablet (81 mg total) by mouth daily. 06/02/11   Sherren Mocha, MD  atorvastatin (LIPITOR) 80 MG tablet Take 1 tablet (80 mg total) by mouth daily. 08/04/14   Sherren Mocha, MD  butalbital-acetaminophen-caffeine (FIORICET) [phone] MG tablet Take 1-2 tablets by mouth every 6 (six) hours as needed  for headache. 03/17/15 03/16/16  Okey Regal, PA-C  hydrochlorothiazide (HYDRODIURIL) 25 MG tablet Take 1 tablet (25 mg total) by mouth daily. 08/04/14   Sherren Mocha, MD  lisinopril (PRINIVIL,ZESTRIL) 20 MG tablet Take 1 tablet (20 mg total) by mouth daily. 08/04/14   Sherren Mocha, MD  metoprolol succinate (TOPROL-XL) 25 MG 24 hr tablet Take 1 tablet (25 mg total) by mouth daily. 08/04/14   Sherren Mocha, MD  metoprolol succinate (TOPROL-XL) 25 MG 24 hr tablet TAKE ONE TABLET BY MOUTH ONE TIME DAILY 08/28/14   Sherren Mocha, MD  OVER THE COUNTER MEDICATION OTC sleep  aide- takes every noc    Historical Provider, MD   BP 118/80 mmHg  Pulse 86  Temp(Src) 98.6 F (37 C) (Oral)  Resp 18  Ht 6\' 2"  (1.88 m)  Wt 106.595 kg  BMI 30.16 kg/m2  SpO2 98% Physical Exam  Constitutional: He is oriented to person, place, and time. He appears well-developed and well-nourished.  HENT:  Head: Normocephalic and atraumatic.  Eyes: Conjunctivae are normal. Pupils are equal, round, and reactive to light. Right eye exhibits no discharge. Left eye exhibits no discharge. No scleral icterus.  Neck: Normal range of motion. No JVD present. No tracheal deviation present.  Cardiovascular: Normal rate, regular rhythm, normal heart sounds and intact distal pulses.  Exam reveals no gallop and no friction rub.   No murmur heard. No carotid bruits  Pulmonary/Chest: Effort normal. No stridor.  Neurological: He is alert and oriented to person, place, and time. He has normal strength. No cranial nerve deficit or sensory deficit. He displays a negative Romberg sign. Coordination and gait normal. GCS eye subscore is 4. GCS verbal subscore is 5. GCS motor subscore is 6.  Reflex Scores:      Patellar reflexes are 2+ on the right side and 2+ on the left side. Skin: Skin is warm and dry. No rash noted. No erythema. No pallor.  Psychiatric: He has a normal mood and affect. His behavior is normal. Judgment and thought content normal.  Nursing note and vitals reviewed.   ED Course  Procedures (including critical care time) Labs Review Labs Reviewed - No data to display  Imaging Review Ct Head Wo Contrast  03/17/2015  CLINICAL DATA:  Pain in posterior head for 2 weeks. Patient denies injury. EXAM: CT HEAD WITHOUT CONTRAST TECHNIQUE: Contiguous axial images were obtained from the base of the skull through the vertex without intravenous contrast. COMPARISON:  05/09/2005. FINDINGS: No evidence for acute infarction, hemorrhage, mass lesion, hydrocephalus, or extra-axial fluid. Normal cerebral  volume. No white matter disease. Calvarium intact. No mastoid or middle ear disease. Incompletely evaluated maxillary sinuses with suggestion of anterior wall deformity and/or layering fluid on the RIGHT. Although the patient's symptoms are described as posterior, if there are features suggestive of sinus headache, CT maxillofacial could provide additional information. Compared to priors, similar appearance although no sinus disease was present at that time. IMPRESSION: No acute intracranial findings. Incompletely evaluated RIGHT maxillary sinus contains fluid. See discussion above. Electronically Signed   By: Staci Righter M.D.   On: 03/17/2015 16:38   I have personally reviewed and evaluated these images and lab results as part of my medical decision-making.   EKG Interpretation None      MDM   Final diagnoses:  Acute nonintractable headache, unspecified headache type    Labs:  Imaging: CT head without  Consults:  Therapeutics:  Discharge Meds: Fioricet  Assessment/Plan: 50 year old male presents today with headache.  Patient is asymptomatic at the time of my evaluation. These headaches are onset while sleeping, resolved by morning. He has no associated neurological deficits with these headaches. CT here showed no significant findings, patient denies any sinus pressure pain, purulent discharge, or any other acute sinusitis type symptoms. Patient is instructed to use Claritin or Benadryl as needed for allergies, Fioricet for the headache, and follow up with neurologist tomorrow to schedule follow-up evaluation. Patient was given strict return precautions, verbalized understanding and agreement to today's plan.        Okey Regal, PA-C 03/17/15 1726  Julianne Rice, MD 03/21/15 [phone]

## 2015-03-17 NOTE — ED Notes (Signed)
C/o pain to back of head when he lies down-3-4 x/week-NAD-steady gait

## 2015-03-17 NOTE — ED Notes (Signed)
MD at bedside. 

## 2015-03-17 NOTE — Discharge Instructions (Signed)
General Headache Without Cause A headache is pain or discomfort felt around the head or neck area. There are many causes and types of headaches. In some cases, the cause may not be found.  HOME CARE  Managing Pain  Take over-the-counter and prescription medicines only as told by your doctor.  Lie down in a dark, quiet room when you have a headache.  If directed, apply ice to the head and neck area:  Put ice in a plastic bag.  Place a towel between your skin and the bag.  Leave the ice on for 20 minutes, 2-3 times per day.  Use a heating pad or hot shower to apply heat to the head and neck area as told by your doctor.  Keep lights dim if bright lights bother you or make your headaches worse. Eating and Drinking  Eat meals on a regular schedule.  Lessen how much alcohol you drink.  Lessen how much caffeine you drink, or stop drinking caffeine. General Instructions  Keep all follow-up visits as told by your doctor. This is important.  Keep a journal to find out if certain things bring on headaches. For example, write down:  What you eat and drink.  How much sleep you get.  Any change to your diet or medicines.  Relax by getting a massage or doing other relaxing activities.  Lessen stress.  Sit up straight. Do not tighten (tense) your muscles.  Do not use tobacco products. This includes cigarettes, chewing tobacco, or e-cigarettes. If you need help quitting, ask your doctor.  Exercise regularly as told by your doctor.  Get enough sleep. This often means 7-9 hours of sleep. GET HELP IF:  Your symptoms are not helped by medicine.  You have a headache that feels different than the other headaches.  You feel sick to your stomach (nauseous) or you throw up (vomit).  You have a fever. GET HELP RIGHT AWAY IF:   Your headache becomes really bad.  You keep throwing up.  You have a stiff neck.  You have trouble seeing.  You have trouble speaking.  You have  pain in the eye or ear.  Your muscles are weak or you lose muscle control.  You lose your balance or have trouble walking.  You feel like you will pass out (faint) or you pass out.  You have confusion.   This information is not intended to replace advice given to you by your health care provider. Make sure you discuss any questions you have with your health care provider.   Document Released: 11/02/2007 Document Revised: 10/14/2014 Document Reviewed: 05/18/2014 Elsevier Interactive Patient Education 2016 [place].   Please contact [facility] and schedule follow-up evaluation. Please return to the emergency room immediately if any new or worsening signs or symptoms present.

## 2015-03-17 NOTE — ED Notes (Signed)
Patient transported to CT 

## 2015-03-19 ENCOUNTER — Ambulatory Visit (INDEPENDENT_AMBULATORY_CARE_PROVIDER_SITE_OTHER): Payer: BLUE CROSS/BLUE SHIELD | Admitting: Neurology

## 2015-03-19 ENCOUNTER — Encounter: Payer: Self-pay | Admitting: Neurology

## 2015-03-19 VITALS — BP 119/76 | HR 58 | Ht 74.0 in | Wt 237.2 lb

## 2015-03-19 DIAGNOSIS — I10 Essential (primary) hypertension: Secondary | ICD-10-CM

## 2015-03-19 DIAGNOSIS — I25119 Atherosclerotic heart disease of native coronary artery with unspecified angina pectoris: Secondary | ICD-10-CM

## 2015-03-19 DIAGNOSIS — M5481 Occipital neuralgia: Secondary | ICD-10-CM | POA: Insufficient documentation

## 2015-03-19 DIAGNOSIS — E785 Hyperlipidemia, unspecified: Secondary | ICD-10-CM | POA: Diagnosis not present

## 2015-03-19 MED ORDER — NORTRIPTYLINE HCL 25 MG PO CAPS: 25.0000 mg | ORAL_CAPSULE | Freq: Every day | ORAL | Status: DC

## 2015-03-19 NOTE — Progress Notes (Signed)
NEUROLOGY CLINIC NEW PATIENT NOTE  NAME: Tim Edwards DOB: [birth year] REFERRING PHYSICIAN: Antonietta Jewel, MD  I saw Tim Edwards as a new consult in the neurovascular clinic today regarding  Chief Complaint  Patient presents with  . New Patient (Initial Visit)    ER referral for Headache at [facility]  .  HPI: Tim Edwards is a 50 y.o. male with PMH of CAD s/p stent, HLD, HTN and smoker who presents as a new patient for HA.   Patient reports for the last 2 weeks he's had headaches that have waking him up from sleep. He reports that after several hours of sleeping he wakes up from sleep with a "throbbing" pain to the right posterior aspect of his head. Sleep on the left side did not get quick resolution. However, in the morning after getting up and after shower, his HA will be gone and there is no headache during the day. He notes that upon initial onset symptoms were present daily at night, then resolved, and it recurred again 3 days ago. He  denies anyneurological deficits, fever, neck stiffness, radiation of symptoms, dizziness, weakness, or any other acute concerning signs or symptoms associated with the headaches. Patient denies any significant past medical history of headaches. He denies any trauma to the head, no pain to palpation of the posterior aspect of the head. He attempted taking for Advil before going to bed last night but this did not prevent the attack from occurring. Patient denies any upper respiratory complaints, recent change in living arrangements, health, or medication changes. He denies any change in workload or stress level.   He has CAD s/p stent following with Dr. Burt Knack in cardiology, on ASA and lipitor, stable. HTN and HLD on  HCTZ, lisinopril and metoprolol as well as lipitor.   He continues to smoke lightly, social alcohol and denies illicit drugs.  Past Medical History  Diagnosis Date  . CAD (coronary artery disease)     a. s/p Xience DES to LAD 4/10;   b. cath 05/28/08: pLAD 90% (tx with DES); left PDA occluded, RCA ok; EF 55%  . Hypertension   . Dyslipidemia   . Hyperlipidemia   . Personal history of colonic polyps - tubular adenomas 08/27/2013  . Headache    Past Surgical History  Procedure Laterality Date  . Vasectomy    . Coronary angioplasty with stent placement    . Colonoscopy     Family History  Problem Relation Age of Onset  . Cancer Father   . Colon cancer Mother   . Aneurysm Mother   . Crohn's disease Brother   . Esophageal cancer Neg Hx   . Stomach cancer Neg Hx   . Rectal cancer Neg Hx    Current Outpatient Prescriptions  Medication Sig Dispense Refill  . aspirin EC 81 MG EC tablet Take 1 tablet (81 mg total) by mouth daily. 1 tablet   . atorvastatin (LIPITOR) 80 MG tablet Take 1 tablet (80 mg total) by mouth daily. 90 tablet 3  . butalbital-acetaminophen-caffeine (FIORICET) 50-325-40 MG tablet Take 1-2 tablets by mouth every 6 (six) hours as needed for headache. 20 tablet 0  . hydrochlorothiazide (HYDRODIURIL) 25 MG tablet Take 1 tablet (25 mg total) by mouth daily. 90 tablet 3  . lisinopril (PRINIVIL,ZESTRIL) 20 MG tablet Take 1 tablet (20 mg total) by mouth daily. 90 tablet 3  . metoprolol succinate (TOPROL-XL) 25 MG 24 hr tablet Take 1 tablet (25 mg total)  by mouth daily. 90 tablet 3  . OVER THE COUNTER MEDICATION OTC sleep aide- takes every noc    . TESTOSTERONE IM Inject into the muscle.    . nortriptyline (PAMELOR) 25 MG capsule Take 1 capsule (25 mg total) by mouth at bedtime. 30 capsule 0   No current facility-administered medications for this visit.   No Known Allergies Social History   Social History  . Marital Status: Married    Spouse Name: N/A  . Number of Children: N/A  . Years of Education: N/A   Occupational History  . Not on file.   Social History Main Topics  . Smoking status: Current Every Day Smoker -- 1.00 packs/day for 20 years    Types: Cigarettes  . Smokeless tobacco: Never  Used  . Alcohol Use: Yes     Comment: socially  . Drug Use: No  . Sexual Activity: Not on file   Other Topics Concern  . Not on file   Social History Narrative   HE lives in [place] with his wife. He works at [employer] in [employer] . He smokes 2 packs of cigarettes a day, has done for 20 years. He drinks socially. Denies any recreational substance use. No diet restrictions. Absolutely no exercise per patient.    Review of Systems Full 14 system review of systems performed and notable only for those listed, all others are neg:  Constitutional:   Cardiovascular:  Ear/Nose/Throat:   Skin:  Eyes:   Respiratory:   Gastroitestinal:   Genitourinary:  Hematology/Lymphatic:   Endocrine:  Musculoskeletal:   Allergy/Immunology:   Neurological:   headache Psychiatric:  Sleep:  Not enough sleep   Physical Exam  Filed Vitals:   03/19/15 0808  BP: 119/76  Pulse: 58    General - Well nourished, well developed, in no apparent distress.  Ophthalmologic - Sharp disc margins OU.  Cardiovascular - Regular rate and rhythm with no murmur. Carotid pulses were 2+ without bruits .   Neck - supple, no nuchal rigidity .  No occipital nerve tenderness on palpation.  Mental Status -  Level of arousal and orientation to time, place, and person were intact. Language including expression, naming, repetition, comprehension, reading, and writing was assessed and found intact. Attention span and concentration were normal. Recent and remote memory were intact. Fund of Knowledge was assessed and was intact.  Cranial Nerves II - XII - II - Visual field intact OU. III, IV, VI - Extraocular movements intact. V - Facial sensation intact bilaterally. VII - Facial movement intact bilaterally. VIII - Hearing & vestibular intact bilaterally X - Palate elevates symmetrically. XI - Chin turning & shoulder shrug intact bilaterally. XII - Tongue protrusion intact.  Motor Strength - The  patient's strength was normal in all extremities and pronator drift was absent.  Bulk was normal and fasciculations were absent.   Motor Tone - Muscle tone was assessed at the neck and appendages and was normal.  Reflexes - The patient's reflexes were normal in all extremities and he had no pathological reflexes.  Sensory - Light touch, temperature/pinprick, vibration and proprioception, and Romberg testing were assessed and were normal.    Coordination - The patient had normal movements in the hands and feet with no ataxia or dysmetria.  Tremor was absent.  Gait and Station - The patient's transfers, posture, gait, station, and turns were observed as normal.   Imaging  I have personally reviewed the radiological images below and agree with the radiology  interpretations.  CT head  03/17/2015 No acute intracranial findings.  Lab Review  none   Assessment and Plan:   In summary, YASHA VANAMBURG is a 50 y.o. male with PMH of CAD s/p stent, HTN and HLD presents with HA during sleep.  Headache located at right occipital region, associate with sleep only, no headache during the day. No hx of migraine. Headache pattern were consistent with right occipital neuralgia. Neuro exam intact without right occipital nerve tenderness on palpation. CT head negative. Recommend to avoid right occipital nerve compression, and prescribe nortriptyline.  - relieve compression at right occipital area during sleep. May consider to sleep on left side, use pillow at the back to avoid right occipital compression. - will prescribe nortriptyline 25mg  at night to help sleep and nerve pain - relaxation and cope with stress - continue ASA and lipitor as instructed. - follow up with PCP and Dr. Burt Knack regularly - follow up in 2 months.   Thank you very much for the opportunity to participate in the care of this patient.  Please do not hesitate to call if any questions or concerns arise.  No orders of the defined  types were placed in this encounter.    Meds ordered this encounter  Medications  . nortriptyline (PAMELOR) 25 MG capsule    Sig: Take 1 capsule (25 mg total) by mouth at bedtime.    Dispense:  30 capsule    Refill:  0    Patient Instructions  - relieve compression at right occipital area during sleep. May consider to sleep on left side, use pillow at the back to avoid right occipital compression. - will prescribe nortriptyline 25mg  at night to help sleep and nerve pain - relaxation and cope with stress - continue ASA and lipitor as instructed. - follow up with PCP and Dr. Burt Knack regularly - follow up in 2 months.    Rosalin Hawking, MD PhD [facility] [address], [place], [place] [address] [phone]

## 2015-03-19 NOTE — Patient Instructions (Addendum)
-   relieve compression at right occipital area during sleep. May consider to sleep on left side, use pillow at the back to avoid right occipital compression. - will prescribe nortriptyline 25mg  at night to help sleep and nerve pain - relaxation and cope with stress - continue ASA and lipitor as instructed. - follow up with PCP and Dr. Burt Knack regularly - follow up in 2 months.

## 2015-03-24 ENCOUNTER — Other Ambulatory Visit (HOSPITAL_COMMUNITY): Payer: Self-pay | Admitting: Internal Medicine

## 2015-03-24 ENCOUNTER — Ambulatory Visit (HOSPITAL_COMMUNITY)
Admission: RE | Admit: 2015-03-24 | Discharge: 2015-03-24 | Disposition: A | Discharge status: Home / Self Care | Payer: BLUE CROSS/BLUE SHIELD | Source: Ambulatory Visit | Attending: Internal Medicine | Admitting: Internal Medicine

## 2015-03-24 DIAGNOSIS — M542 Cervicalgia: Secondary | ICD-10-CM | POA: Diagnosis not present

## 2015-03-24 DIAGNOSIS — M5114 Intervertebral disc disorders with radiculopathy, thoracic region: Secondary | ICD-10-CM | POA: Diagnosis not present

## 2015-03-24 DIAGNOSIS — M5011 Cervical disc disorder with radiculopathy,  high cervical region: Secondary | ICD-10-CM | POA: Insufficient documentation

## 2015-05-26 ENCOUNTER — Ambulatory Visit: Payer: Self-pay | Admitting: Neurology

## 2015-05-26 ENCOUNTER — Ambulatory Visit: Payer: BLUE CROSS/BLUE SHIELD | Admitting: Neurology

## 2015-06-18 DIAGNOSIS — M25562 Pain in left knee: Secondary | ICD-10-CM | POA: Diagnosis not present

## 2015-06-18 DIAGNOSIS — L0390 Cellulitis, unspecified: Secondary | ICD-10-CM | POA: Diagnosis not present

## 2015-06-18 DIAGNOSIS — R5383 Other fatigue: Secondary | ICD-10-CM | POA: Diagnosis not present

## 2015-06-18 DIAGNOSIS — E7800 Pure hypercholesterolemia, unspecified: Secondary | ICD-10-CM | POA: Diagnosis not present

## 2015-06-18 DIAGNOSIS — I10 Essential (primary) hypertension: Secondary | ICD-10-CM | POA: Diagnosis not present

## 2015-06-18 DIAGNOSIS — E291 Testicular hypofunction: Secondary | ICD-10-CM | POA: Diagnosis not present

## 2015-06-22 DIAGNOSIS — I10 Essential (primary) hypertension: Secondary | ICD-10-CM | POA: Diagnosis not present

## 2015-06-22 DIAGNOSIS — M542 Cervicalgia: Secondary | ICD-10-CM | POA: Diagnosis not present

## 2015-06-22 DIAGNOSIS — R51 Headache: Secondary | ICD-10-CM | POA: Diagnosis not present

## 2015-06-22 DIAGNOSIS — M25562 Pain in left knee: Secondary | ICD-10-CM | POA: Diagnosis not present

## 2015-07-05 DIAGNOSIS — H5713 Ocular pain, bilateral: Secondary | ICD-10-CM | POA: Diagnosis not present

## 2015-07-05 DIAGNOSIS — H10503 Unspecified blepharoconjunctivitis, bilateral: Secondary | ICD-10-CM | POA: Diagnosis not present

## 2015-07-06 DIAGNOSIS — H109 Unspecified conjunctivitis: Secondary | ICD-10-CM | POA: Diagnosis not present

## 2015-07-06 DIAGNOSIS — L0390 Cellulitis, unspecified: Secondary | ICD-10-CM | POA: Diagnosis not present

## 2015-07-06 DIAGNOSIS — R51 Headache: Secondary | ICD-10-CM | POA: Diagnosis not present

## 2015-07-06 DIAGNOSIS — R5383 Other fatigue: Secondary | ICD-10-CM | POA: Diagnosis not present

## 2015-08-04 DIAGNOSIS — I10 Essential (primary) hypertension: Secondary | ICD-10-CM | POA: Diagnosis not present

## 2015-08-04 DIAGNOSIS — J019 Acute sinusitis, unspecified: Secondary | ICD-10-CM | POA: Diagnosis not present

## 2015-08-04 DIAGNOSIS — M542 Cervicalgia: Secondary | ICD-10-CM | POA: Diagnosis not present

## 2015-08-04 DIAGNOSIS — M25562 Pain in left knee: Secondary | ICD-10-CM | POA: Diagnosis not present

## 2015-08-12 ENCOUNTER — Other Ambulatory Visit: Payer: Self-pay | Admitting: Cardiovascular Disease

## 2015-08-16 ENCOUNTER — Other Ambulatory Visit: Payer: Self-pay

## 2015-08-16 MED ORDER — METOPROLOL SUCCINATE ER 25 MG PO TB24: 25.0000 mg | ORAL_TABLET | Freq: Every day | ORAL | Status: DC

## 2015-08-16 NOTE — Telephone Encounter (Signed)
Sherren Mocha, MD at 07/30/2014 8:12 AM  metoprolol succinate (TOPROL-XL) 25 MG 24 hr tabletTAKE ONE TABLET BY MOUTH ONE TIME DAILY Current medicines are reviewed with the patient today. The patient does not have concerns regarding medicines. Patient Instructions     Medication Instructions:  Your physician recommends that you continue on your current medications as directed. Please refer to the Current Medication list given to you today.

## 2015-08-26 ENCOUNTER — Other Ambulatory Visit: Payer: Self-pay | Admitting: *Deleted

## 2015-08-26 MED ORDER — HYDROCHLOROTHIAZIDE 25 MG PO TABS: 25.0000 mg | ORAL_TABLET | Freq: Every day | ORAL | Status: DC

## 2015-10-04 ENCOUNTER — Other Ambulatory Visit: Payer: Self-pay | Admitting: Cardiovascular Disease

## 2015-11-13 ENCOUNTER — Other Ambulatory Visit: Payer: Self-pay | Admitting: Cardiovascular Disease

## 2016-07-07 ENCOUNTER — Encounter: Payer: Self-pay | Admitting: Cardiovascular Disease

## 2016-07-07 ENCOUNTER — Telehealth: Payer: Self-pay | Admitting: Cardiovascular Disease

## 2016-07-07 ENCOUNTER — Other Ambulatory Visit: Payer: Self-pay | Admitting: Cardiovascular Disease

## 2016-07-07 NOTE — Telephone Encounter (Signed)
Pt called and stated he did not want to wait until September to see Dr. Burt Knack. He tried to call and schedule an appointment and was not able to get in until September. He would like to get seen sooner. Advised pt I would send message to Dr. Antionette Char nurse to see if she can get pt seen by Dr. Burt Knack sooner and that she is off today but would be back in office next week.Pt thanked me for my call

## 2016-07-07 NOTE — Telephone Encounter (Signed)
New Message     Please call pt would like to speak with you about an appointment, does not want to wait until September

## 2016-07-12 NOTE — Telephone Encounter (Signed)
I spoke with the pt and he has been seeing his company medical doctor on a regular basis and had labs and medication adjustments made.  The pt would like to follow-up with Dr Burt Knack to discuss these changes. I have scheduled the pt for an appointment with Dr Burt Knack on 07/17/16. The pt will have his labs and records faxed to our office.

## 2016-07-17 ENCOUNTER — Encounter: Payer: Self-pay | Admitting: Cardiovascular Disease

## 2016-07-17 ENCOUNTER — Ambulatory Visit (INDEPENDENT_AMBULATORY_CARE_PROVIDER_SITE_OTHER): Payer: BLUE CROSS/BLUE SHIELD | Admitting: Cardiovascular Disease

## 2016-07-17 VITALS — BP 116/78 | HR 74 | Ht 74.0 in | Wt 253.4 lb

## 2016-07-17 DIAGNOSIS — E785 Hyperlipidemia, unspecified: Secondary | ICD-10-CM | POA: Diagnosis not present

## 2016-07-17 DIAGNOSIS — I2510 Atherosclerotic heart disease of native coronary artery without angina pectoris: Secondary | ICD-10-CM

## 2016-07-17 DIAGNOSIS — I10 Essential (primary) hypertension: Secondary | ICD-10-CM

## 2016-07-17 MED ORDER — LISINOPRIL 20 MG PO TABS: 20.0000 mg | ORAL_TABLET | Freq: Every day | ORAL | 3 refills | Status: DC

## 2016-07-17 MED ORDER — ATORVASTATIN CALCIUM 80 MG PO TABS: 80.0000 mg | ORAL_TABLET | Freq: Every day | ORAL | 3 refills | Status: DC

## 2016-07-17 NOTE — Progress Notes (Signed)
Cardiology Office Note Date:  07/19/2016   ID:  Tim Edwards, DOB 1965-07-19, MRN [id number]  PCP:  Leonides Sake, MD  Cardiologist:  Sherren Mocha, MD    Chief Complaint  Patient presents with  . Follow-up    discuss med changes     History of Present Illness: Tim Edwards is a 51 y.o. male who presents for follow-up of CAD, last seen in 2016.  He has coronary artery disease after presenting with acute coronary syndrome in 2010. He was treated with a  drug-eluting stent to the LAD. 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 The patient's left ventricular ejection fraction was 55%.  Since his last visit he continues to play tennis without exertional symptoms. He is still smoking, only at work. No other issues.   Past Medical History:  Diagnosis Date  . CAD (coronary artery disease)    a. s/p Xience DES to LAD 4/10;  b. cath 05/28/08: pLAD 90% (tx with DES); left PDA occluded, RCA ok; EF 55%  . Dyslipidemia   . Headache   . Hyperlipidemia   . Hypertension   . Personal history of colonic polyps - tubular adenomas 08/27/2013    Past Surgical History:  Procedure Laterality Date  . COLONOSCOPY    . CORONARY ANGIOPLASTY WITH STENT PLACEMENT    . VASECTOMY      Current Outpatient Prescriptions  Medication Sig Dispense Refill  . allopurinol (ZYLOPRIM) 100 MG tablet Take 100 mg by mouth daily.    Marland Kitchen aspirin EC 81 MG EC tablet Take 1 tablet (81 mg total) by mouth daily. 1 tablet   . atorvastatin (LIPITOR) 80 MG tablet Take 1 tablet (80 mg total) by mouth daily. 90 tablet 3  . doxazosin (CARDURA) 4 MG tablet Take 4 mg by mouth daily.    Marland Kitchen lisinopril (PRINIVIL,ZESTRIL) 20 MG tablet Take 1 tablet (20 mg total) by mouth daily. 90 tablet 3  . OVER THE COUNTER MEDICATION Take 50 mg by mouth at bedtime. Benadryl     No current facility-administered medications for this visit.     Allergies:   Patient has no known allergies.   Social History:   The patient  reports that he has been smoking Cigarettes.  He has a 20.00 pack-year smoking history. He has never used smokeless tobacco. He reports that he drinks alcohol. He reports that he does not use drugs.   Family History:  The patient's  family history includes Aneurysm in his mother; Cancer in his father; Colon cancer in his mother; Crohn's disease in his brother.    ROS:  Please see the history of present illness.  All other systems are reviewed and negative.    PHYSICAL EXAM: VS:  BP 116/78   Pulse 74   Ht 6\' 2"  (1.88 m)   Wt 253 lb 6.4 oz (114.9 kg)   BMI 32.53 kg/m  , BMI Body mass index is 32.53 kg/m. GEN: Well nourished, well developed, in no acute distress  HEENT: normal  Neck: no JVD, no masses. No carotid bruits Cardiac: RRR without murmur or gallop                Respiratory:  clear to auscultation bilaterally, normal work of breathing GI: soft, nontender, nondistended, + BS MS: no deformity or atrophy  Ext: no pretibial edema, pedal pulses 2+= bilaterally Skin: warm and dry, no rash Neuro:  Strength and sensation are intact Psych: euthymic mood, full  affect  EKG:  EKG is ordered today. The ekg ordered today shows NSR, within normal limits  Recent Labs: No results found for requested labs within last 8760 hours.   Lipid Panel     Component Value Date/Time   CHOL 120 11/09/2014 0754   TRIG 71.0 11/09/2014 0754   HDL 34.50 (L) 11/09/2014 0754   CHOLHDL 3 11/09/2014 0754   VLDL 14.2 11/09/2014 0754   LDLCALC 71 11/09/2014 0754      Wt Readings from Last 3 Encounters:  07/17/16 253 lb 6.4 oz (114.9 kg)  03/19/15 237 lb 3.2 oz (107.6 kg)  03/17/15 235 lb (106.6 kg)    ASSESSMENT AND PLAN: 1.  CAD, native vessel: no angina. Medications reviewed and will be continued with ASA, a high intensity statin, and a beta blocker  2. HTN: BP controlled on current Rx.   3. Hyperlipidemia: discussed lifestyle modification. He has gained weight. Needs exercise.  Labs reviewed. Continue current Rx (atorvastatin 80 mg).  4. Tobacco abuse: cessation counseling done.   Current medicines are reviewed with the patient today.  The patient does not have concerns regarding medicines.  Labs/ tests ordered today include:   Orders Placed This Encounter  Procedures  . EKG 12-Lead    Disposition:   FU 2 years  Signed, Sherren Mocha, MD  07/19/2016 11:32 PM    [place], [place],   [address] Phone: [phone]; Fax: [phone]

## 2016-07-17 NOTE — Patient Instructions (Signed)
Medication Instructions:  Your physician recommends that you continue on your current medications as directed. Please refer to the Current Medication list given to you today.  Labwork: No new orders.   Testing/Procedures: No new orders.   Follow-Up: Your physician wants you to follow-up in: 2 YEARS with Dr Burt Knack.  You will receive a reminder letter in the mail two months in advance. If you don't receive a letter, please call our office to schedule the follow-up appointment.   Any Other Special Instructions Will Be Listed Below (If Applicable).     If you need a refill on your cardiac medications before your next appointment, please call your pharmacy.

## 2016-10-02 ENCOUNTER — Encounter: Payer: Self-pay | Admitting: Internal Medicine

## 2016-10-11 ENCOUNTER — Telehealth: Payer: Self-pay | Admitting: Internal Medicine

## 2016-10-11 NOTE — Telephone Encounter (Signed)
?'  s about preventive vs hx polyps coding for his colonoscopy I told him likely not preventive and I would check about it

## 2017-06-13 ENCOUNTER — Encounter: Payer: Self-pay | Admitting: Cardiovascular Disease

## 2017-07-10 ENCOUNTER — Other Ambulatory Visit: Payer: Self-pay | Admitting: Cardiovascular Disease

## 2017-07-31 ENCOUNTER — Other Ambulatory Visit: Payer: Self-pay | Admitting: Cardiovascular Disease

## 2017-09-27 IMAGING — DX DG CERVICAL SPINE COMPLETE 4+V
8 of 9 series · 8 of 9 positions shown · non-contrast
Comparison: None.

CLINICAL DATA: Intense neck pain for several weeks, no injury

EXAM:
CERVICAL SPINE - COMPLETE 4+ VIEW

[c-spine lat]
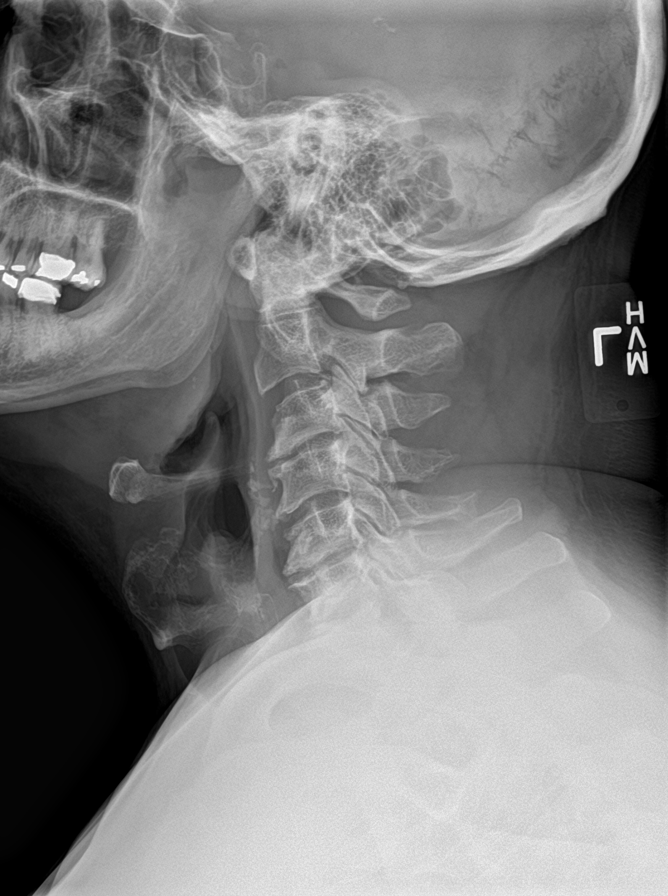

[c-spine obl (1 of 3)]
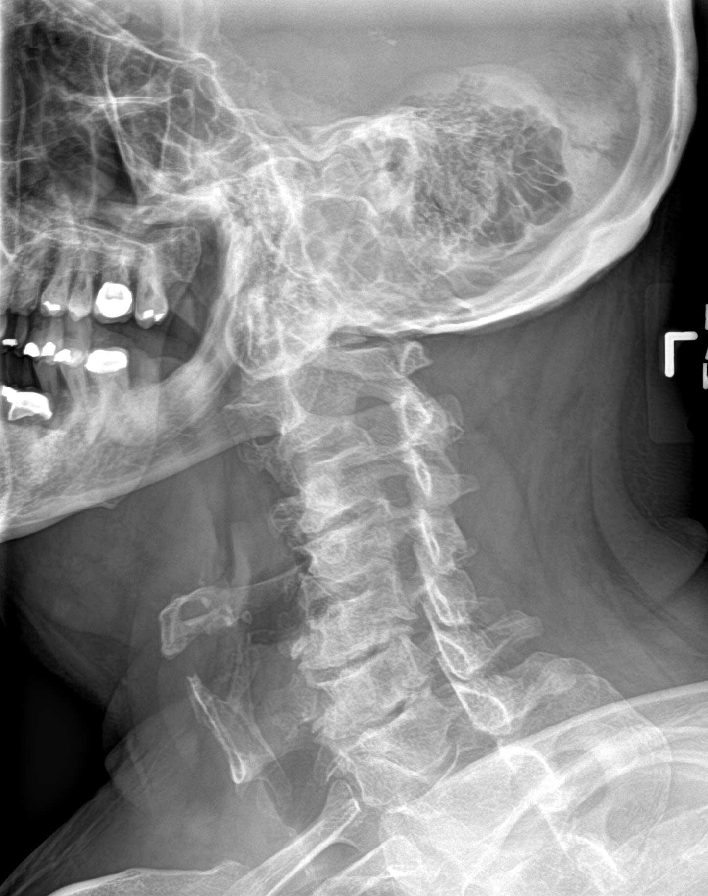

[c-spine obl (2 of 3)]
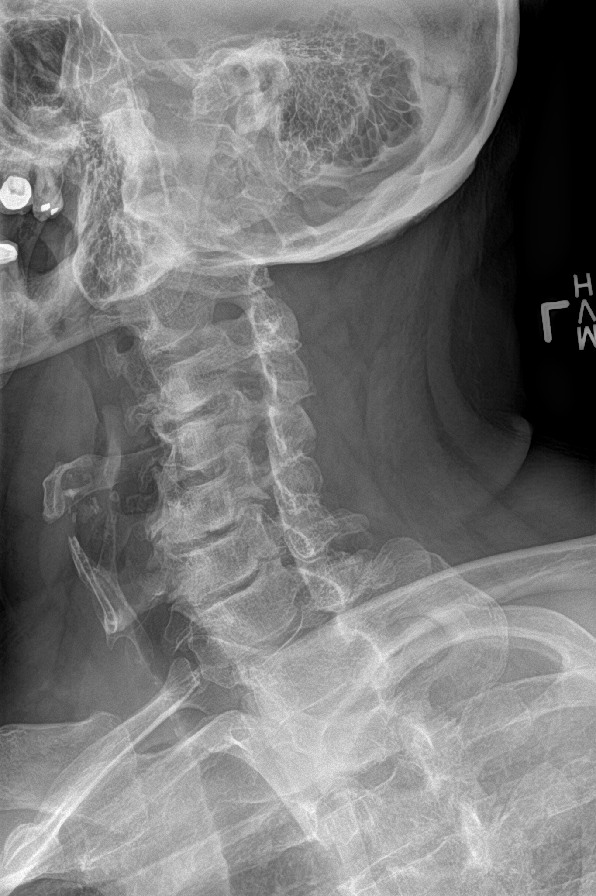

[c-spine ap]
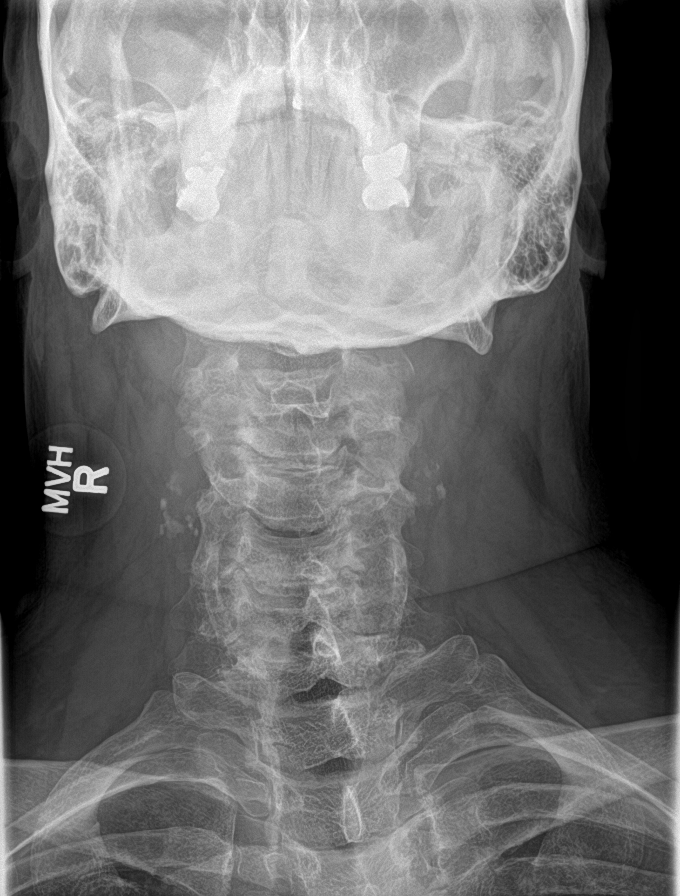

[c-spine open mouth]
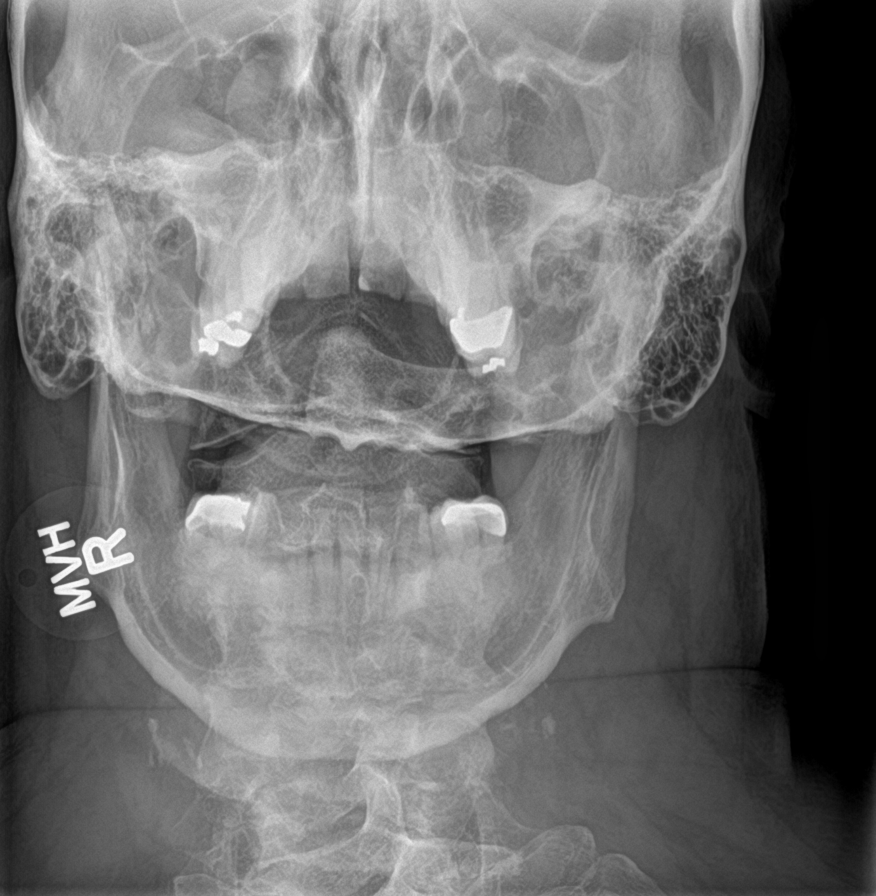

[c-spine swimmers]
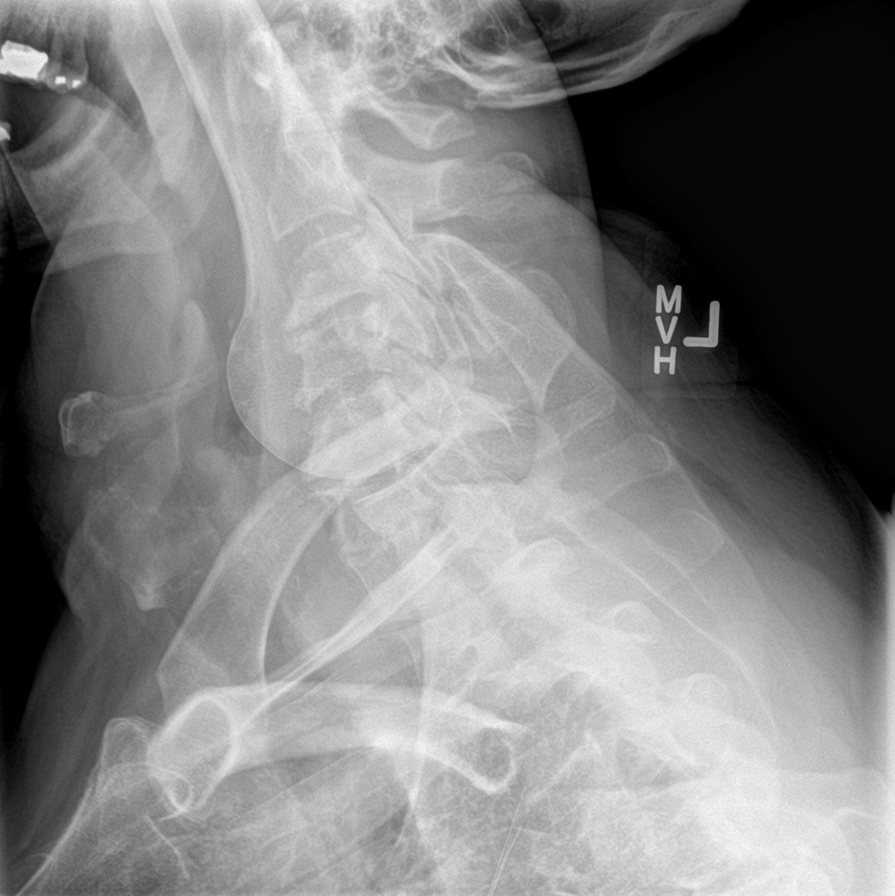

[[person_name]]
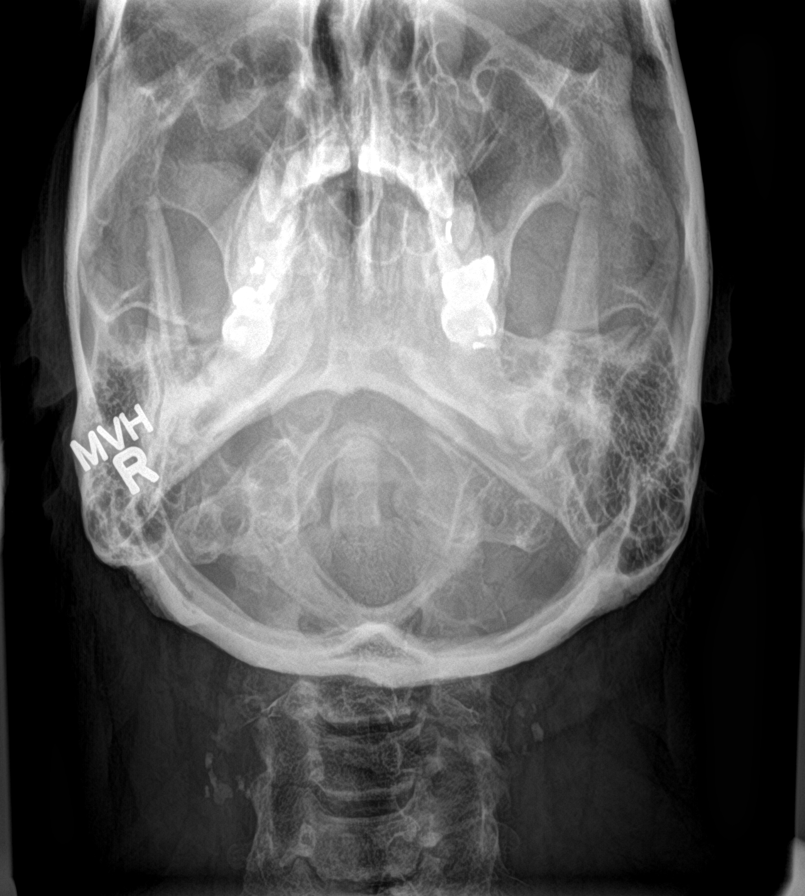

[c-spine obl (3 of 3)]
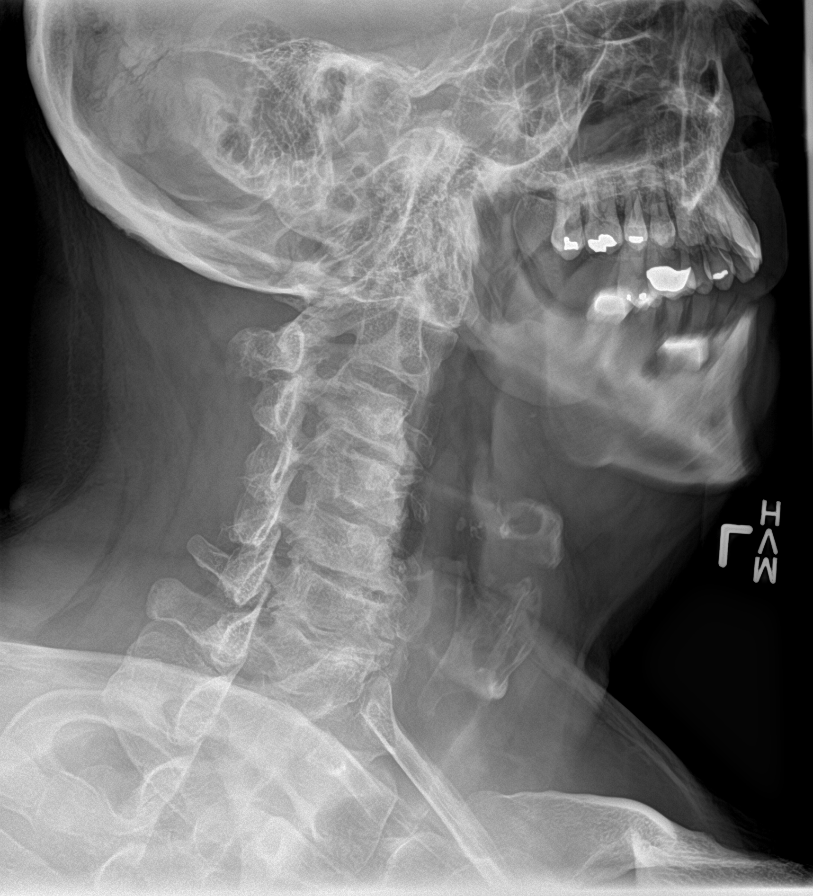

[8 of 9 positions shown; findings below may reference images not displayed]

FINDINGS: The cervical vertebrae are slightly straightened in alignment. There
is diffuse degenerative disc disease from C3-T1 with loss of
intervertebral disc space, sclerosis, and spurring. No prevertebral
soft tissue swelling is seen. There is foraminal narrowing
bilaterally particularly at C5-6 and C6-7 levels. The odontoid
process is intact. The lung apices are clear. Calcification is noted
at the bifurcations of the internal and external carotid arteries
bilaterally.
IMPRESSION: 1. Somewhat straightened alignment with diffuse degenerative disc
disease from C3-T1.
2. Moderate foraminal narrowing particularly bilaterally at C5-6 and
C6-7.

## 2018-06-26 ENCOUNTER — Other Ambulatory Visit: Payer: Self-pay

## 2018-06-26 ENCOUNTER — Telehealth (INDEPENDENT_AMBULATORY_CARE_PROVIDER_SITE_OTHER): Payer: BLUE CROSS/BLUE SHIELD | Admitting: Cardiovascular Disease

## 2018-06-26 ENCOUNTER — Encounter: Payer: Self-pay | Admitting: Cardiovascular Disease

## 2018-06-26 VITALS — BP 145/86 | HR 85 | Ht 74.0 in | Wt 248.0 lb

## 2018-06-26 DIAGNOSIS — I10 Essential (primary) hypertension: Secondary | ICD-10-CM

## 2018-06-26 DIAGNOSIS — E782 Mixed hyperlipidemia: Secondary | ICD-10-CM

## 2018-06-26 DIAGNOSIS — I2510 Atherosclerotic heart disease of native coronary artery without angina pectoris: Secondary | ICD-10-CM | POA: Diagnosis not present

## 2018-06-26 NOTE — Progress Notes (Signed)
Virtual Visit via Video Note   This visit type was conducted due to national recommendations for restrictions regarding the COVID-19 Pandemic (e.g. social distancing) in an effort to limit this patient's exposure and mitigate transmission in our community.  Due to his co-morbid illnesses, this patient is at least at moderate risk for complications without adequate follow up.  This format is felt to be most appropriate for this patient at this time.  All issues noted in this document were discussed and addressed.  A limited physical exam was performed with this format.  Please refer to the patient's chart for his consent to telehealth for [facility].   Date:  06/26/2018   ID:  Tim Edwards, DOB [birth year], MRN [id number]  Patient Location: Home Provider Location: Home  PCP:  Leonides Sake, MD  Cardiologist:  No primary care provider on file.  Electrophysiologist:  None   Evaluation Performed:  Follow-Up Visit  Chief Complaint:  CAD follow-up  History of Present Illness:    Tim Edwards is a 53 y.o. male with history of CAD.   He has coronary artery disease after presenting with acute coronary syndrome in 2010. He was treated with a drug-eluting stent to the LAD. 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 The patient's left ventricular ejection fraction was 55%.  The patient does not have symptoms concerning for COVID-19 infection (fever, chills, cough, or new shortness of breath).   The patient has been doing well.  His daughter has been diagnosed with COVID-19.  She is asymptomatic but they are all under quarantine at the present time.  He has not been sick.  He specifically denies chest pain, chest pressure, shortness of breath, or leg swelling.  He has been exercising regularly and playing tennis as well as doing yard work.  He denies any exertional symptoms.  He is compliant with his medications.  All of his labs are followed through his  primary care physician.  Past Medical History:  Diagnosis Date  . CAD (coronary artery disease)    a. s/p Xience DES to LAD 4/10;  b. cath 05/28/08: pLAD 90% (tx with DES); left PDA occluded, RCA ok; EF 55%  . Dyslipidemia   . Headache   . Hyperlipidemia   . Hypertension   . Personal history of colonic polyps - tubular adenomas 08/27/2013   Past Surgical History:  Procedure Laterality Date  . COLONOSCOPY    . CORONARY ANGIOPLASTY WITH STENT PLACEMENT    . VASECTOMY       Current Meds  Medication Sig  . allopurinol (ZYLOPRIM) 300 MG tablet Take 300 mg by mouth daily.   Marland Kitchen aspirin EC 81 MG EC tablet Take 1 tablet (81 mg total) by mouth daily.  Marland Kitchen atorvastatin (LIPITOR) 80 MG tablet Take 1 tablet (80 mg total) by mouth daily.  Marland Kitchen doxazosin (CARDURA) 4 MG tablet Take 4 mg by mouth daily.  Marland Kitchen lisinopril (PRINIVIL,ZESTRIL) 20 MG tablet TAKE ONE TABLET BY MOUTH DAILY  . metoprolol succinate (TOPROL-XL) 50 MG 24 hr tablet Take 50 mg by mouth daily. Take with or immediately following a meal.  . OVER THE COUNTER MEDICATION Take 50 mg by mouth at bedtime. Benadryl     Allergies:   Patient has no known allergies.   Social History   Tobacco Use  . Smoking status: Current Every Day Smoker    Packs/day: 1.00    Years: 20.00    Pack years: 20.00  Types: Cigarettes  . Smokeless tobacco: Never Used  Substance Use Topics  . Alcohol use: Yes    Comment: socially  . Drug use: No     Family Hx: The patient's family history includes Aneurysm in his mother; Cancer in his father; Colon cancer in his mother; Crohn's disease in his brother. There is no history of Esophageal cancer, Stomach cancer, or Rectal cancer.  ROS:   Please see the history of present illness.    All other systems reviewed and are negative.   Labs/Other Tests and Data Reviewed:    EKG:  No ECG reviewed.  Recent Labs: No results found for requested labs within last 8760 hours.   Recent Lipid Panel Lab Results   Component Value Date/Time   CHOL 120 11/09/2014 07:54 AM   TRIG 71.0 11/09/2014 07:54 AM   HDL 34.50 (L) 11/09/2014 07:54 AM   CHOLHDL 3 11/09/2014 07:54 AM   LDLCALC 71 11/09/2014 07:54 AM    Wt Readings from Last 3 Encounters:  06/26/18 248 lb (112.5 kg)  07/17/16 253 lb 6.4 oz (114.9 kg)  03/19/15 237 lb 3.2 oz (107.6 kg)     Objective:    Vital Signs:  BP (!) 145/86   Pulse 85   Ht 6\' 2"  (1.88 m)   Wt 248 lb (112.5 kg)   BMI 31.84 kg/m    VITAL SIGNS:  reviewed The patient is alert, oriented, in no distress.  Remaining exam is not performed today as this is a virtual telehealth visit.  The patient is breathing comfortably during normal conversation.  ASSESSMENT & PLAN:    1. Coronary artery disease, native vessel, without angina: The patient is stable on his current medical program.  This includes aspirin, a beta-blocker, and a statin drug.  I will see him back in 1 year.  We discussed lifestyle modification at length today.  He will work on diet and exercise. 2. Mixed hyperlipidemia: He had recent lab work drawn by his primary care physician.  I did review old labs today.  He continues on a high intensity statin drug.  We discussed measures directed at 20 to 25 pounds of weight loss today. 3. Essential hypertension: He reports recent blood pressures averaging in the range of 125 to 130 mmHg over 70 to 80 mmHg.  He will continue on his current regimen.  For follow-up I would like to see the patient back in 1 year.  COVID-19 Education: The signs and symptoms of COVID-19 were discussed with the patient and how to seek care for testing (follow up with PCP or arrange E-visit). The importance of social distancing was discussed today.  Time:   Today, I have spent 17 minutes with the patient with telehealth technology discussing the above problems.     Medication Adjustments/Labs and Tests Ordered: Current medicines are reviewed at length with the patient today.  Concerns  regarding medicines are outlined above.   Tests Ordered: No orders of the defined types were placed in this encounter.   Medication Changes: No orders of the defined types were placed in this encounter.   Disposition:  Follow up in 1 year(s)  Signed, Sherren Mocha, MD  06/26/2018 1:35 PM    [facility]

## 2018-07-18 ENCOUNTER — Other Ambulatory Visit: Payer: Self-pay | Admitting: Cardiovascular Disease

## 2018-07-22 ENCOUNTER — Other Ambulatory Visit: Payer: Self-pay | Admitting: Cardiovascular Disease

## 2018-11-14 DIAGNOSIS — Z23 Encounter for immunization: Secondary | ICD-10-CM | POA: Diagnosis not present

## 2018-12-23 ENCOUNTER — Telehealth: Payer: Self-pay

## 2018-12-23 NOTE — Telephone Encounter (Signed)
The patient sent Dr. Burt Knack a message requesting an earlier appointment (he is due for yearly in May '21) so he can change life insurance plans. Scheduled the patient 01/17/19. He was grateful for call and agrees with treatment plan.

## 2019-01-17 ENCOUNTER — Other Ambulatory Visit: Payer: Self-pay

## 2019-01-17 ENCOUNTER — Encounter: Payer: Self-pay | Admitting: Cardiovascular Disease

## 2019-01-17 ENCOUNTER — Ambulatory Visit (INDEPENDENT_AMBULATORY_CARE_PROVIDER_SITE_OTHER): Payer: BC Managed Care – PPO | Admitting: Cardiovascular Disease

## 2019-01-17 VITALS — BP 112/80 | HR 70 | Ht 74.0 in | Wt 257.0 lb

## 2019-01-17 DIAGNOSIS — I10 Essential (primary) hypertension: Secondary | ICD-10-CM

## 2019-01-17 DIAGNOSIS — E782 Mixed hyperlipidemia: Secondary | ICD-10-CM

## 2019-01-17 DIAGNOSIS — I2510 Atherosclerotic heart disease of native coronary artery without angina pectoris: Secondary | ICD-10-CM

## 2019-01-17 NOTE — Progress Notes (Signed)
Cardiology Office Note:    Date:  01/17/2019   ID:  Tim Edwards, DOB 25-Jan-1966, MRN [id number]  PCP:  Leonides Sake, MD  Cardiologist:  No primary care provider on file.  Electrophysiologist:  None   Referring MD: Leonides Sake, MD   Chief Complaint  Patient presents with  . Coronary Artery Disease   History of Present Illness:    Tim Edwards is a 53 y.o. male with a hx of coronary artery disease, presenting for follow-up evaluation.  The patient initially presented in 2010 with non-STEMI and was found to have severe stenosis of the LAD, treated with PCI using a drug-eluting stent.  His LV function has been preserved with an LVEF of 55%.  He has done very well over the last decade of follow-up.  His hypertension and hyperlipidemia have been well treated.  He remains physically active and denies any symptoms of chest pain, chest pressure, or shortness of breath.  He said no heart palpitations, lightheadedness, edema, or syncope.  Past Medical History:  Diagnosis Date  . CAD (coronary artery disease)    a. s/p Xience DES to LAD 4/10;  b. cath 05/28/08: pLAD 90% (tx with DES); left PDA occluded, RCA ok; EF 55%  . Dyslipidemia   . Headache   . Hyperlipidemia   . Hypertension   . Personal history of colonic polyps - tubular adenomas 08/27/2013    Past Surgical History:  Procedure Laterality Date  . COLONOSCOPY    . CORONARY ANGIOPLASTY WITH STENT PLACEMENT    . VASECTOMY      Current Medications: Current Meds  Medication Sig  . allopurinol (ZYLOPRIM) 300 MG tablet Take 300 mg by mouth daily.   Marland Kitchen aspirin EC 81 MG EC tablet Take 1 tablet (81 mg total) by mouth daily.  Marland Kitchen atorvastatin (LIPITOR) 80 MG tablet TAKE ONE TABLET BY MOUTH DAILY  . doxazosin (CARDURA) 4 MG tablet Take 4 mg by mouth daily.  Marland Kitchen lisinopril (ZESTRIL) 20 MG tablet TAKE ONE TABLET BY MOUTH ONCE DAILY  . metoprolol succinate (TOPROL-XL) 50 MG 24 hr tablet Take 50 mg by mouth daily. Take with or  immediately following a meal.  . OVER THE COUNTER MEDICATION Take 50 mg by mouth at bedtime. Benadryl     Allergies:   Patient has no known allergies.   Social History   Socioeconomic History  . Marital status: Married    Spouse name: Not on file  . Number of children: Not on file  . Years of education: Not on file  . Highest education level: Not on file  Occupational History  . Not on file  Tobacco Use  . Smoking status: Current Every Day Smoker    Packs/day: 1.00    Years: 20.00    Pack years: 20.00    Types: Cigarettes  . Smokeless tobacco: Never Used  Substance and Sexual Activity  . Alcohol use: Yes    Comment: socially  . Drug use: No  . Sexual activity: Not on file  Other Topics Concern  . Not on file  Social History Narrative   HE lives in [place] with his wife. He works at [employer] in [employer] . He smokes 2 packs of cigarettes a day, has done for 20 years. He drinks socially. Denies any recreational substance use. No diet restrictions. Absolutely no exercise per patient.   Social Determinants of Health   Financial Resource Strain:   . Difficulty of Paying Living Expenses: Not  on file  Food Insecurity:   . Worried About Charity fundraiser in the Last Year: Not on file  . Ran Out of Food in the Last Year: Not on file  Transportation Needs:   . Lack of Transportation (Medical): Not on file  . Lack of Transportation (Non-Medical): Not on file  Physical Activity:   . Days of Exercise per Week: Not on file  . Minutes of Exercise per Session: Not on file  Stress:   . Feeling of Stress : Not on file  Social Connections:   . Frequency of Communication with Friends and Family: Not on file  . Frequency of Social Gatherings with Friends and Family: Not on file  . Attends Religious Services: Not on file  . Active Member of Clubs or Organizations: Not on file  . Attends Archivist Meetings: Not on file  . Marital Status: Not on file      Family History: The patient's family history includes Aneurysm in his mother; Cancer in his father; Colon cancer in his mother; Crohn's disease in his brother. There is no history of Esophageal cancer, Stomach cancer, or Rectal cancer.  ROS:   Please see the history of present illness.    All other systems reviewed and are negative.  EKGs/Labs/Other Studies Reviewed:    EKG:  EKG is ordered today.  The ekg ordered today demonstrates normal sinus rhythm 70 bpm, within normal limits.  Recent Labs: No results found for requested labs within last 8760 hours.  Recent Lipid Panel    Component Value Date/Time   CHOL 120 11/09/2014 0754   TRIG 71.0 11/09/2014 0754   HDL 34.50 (L) 11/09/2014 0754   CHOLHDL 3 11/09/2014 0754   VLDL 14.2 11/09/2014 0754   LDLCALC 71 11/09/2014 0754    Physical Exam:    VS:  BP 112/80   Pulse 70   Ht 6\' 2"  (1.88 m)   Wt 257 lb (116.6 kg)   SpO2 96%   BMI 33.00 kg/m     Wt Readings from Last 3 Encounters:  01/17/19 257 lb (116.6 kg)  06/26/18 248 lb (112.5 kg)  07/17/16 253 lb 6.4 oz (114.9 kg)     GEN:  Well nourished, well developed in no acute distress HEENT: Normal NECK: No JVD; No carotid bruits LYMPHATICS: No lymphadenopathy CARDIAC: RRR, no murmurs, rubs, gallops RESPIRATORY:  Clear to auscultation without rales, wheezing or rhonchi  ABDOMEN: Soft, non-tender, non-distended MUSCULOSKELETAL:  No edema; No deformity  SKIN: Warm and dry NEUROLOGIC:  Alert and oriented x 3 PSYCHIATRIC:  Normal affect   ASSESSMENT:    1. Mixed hyperlipidemia   2. Coronary artery disease involving native coronary artery of native heart without angina pectoris   3. Essential hypertension, benign    PLAN:    In order of problems listed above:  1. Treated with a high intensity statin drug.  Lipids followed by his primary physician.  We will send for a copy of his most recent labs. 2. Stable without symptoms of angina.  He remains on antiplatelet  therapy with aspirin, high intensity statin drug, and ACE inhibitor, and a beta-blocker. 3. Blood pressure is under ideal control on lisinopril and metoprolol succinate.   Medication Adjustments/Labs and Tests Ordered: Current medicines are reviewed at length with the patient today.  Concerns regarding medicines are outlined above.  Orders Placed This Encounter  Procedures  . EKG 12-Lead   No orders of the defined types were placed in this  encounter.   Patient Instructions  Medication Instructions:  Your provider recommends that you continue on your current medications as directed. Please refer to the Current Medication list given to you today.   *If you need a refill on your cardiac medications before your next appointment, please call your pharmacy*   Follow-Up: At [facility], you and your health needs are our priority.  As part of our continuing mission to provide you with exceptional heart care, we have created designated Provider Care Teams.  These Care Teams include your primary Cardiologist (physician) and Advanced Practice Providers (APPs -  Physician Assistants and Nurse Practitioners) who all work together to provide you with the care you need, when you need it.  Your next appointment:   12 month(s)  The format for your next appointment:   In Person  Provider:   You may see Dr. Sherren Mocha or one of the following Advanced Practice Providers on your designated Care Team:    Richardson Dopp, PA-C  [place]in [place], [place]  Daune Perch, [place]     Signed, Sherren Mocha, MD  01/17/2019 1:20 PM    [place]

## 2019-01-17 NOTE — Patient Instructions (Signed)
Medication Instructions:  Your provider recommends that you continue on your current medications as directed. Please refer to the Current Medication list given to you today.   *If you need a refill on your cardiac medications before your next appointment, please call your pharmacy*   Follow-Up: At [facility], you and your health needs are our priority.  As part of our continuing mission to provide you with exceptional heart care, we have created designated Provider Care Teams.  These Care Teams include your primary Cardiologist (physician) and Advanced Practice Providers (APPs -  Physician Assistants and Nurse Practitioners) who all work together to provide you with the care you need, when you need it.  Your next appointment:   12 month(s)  The format for your next appointment:   In Person  Provider:   You may see Dr. Sherren Mocha or one of the following Advanced Practice Providers on your designated Care Team:    Richardson Dopp, PA-C  [place], [place]  Daune Perch, [place]

## 2019-07-25 ENCOUNTER — Telehealth: Payer: Self-pay | Admitting: Cardiovascular Disease

## 2019-07-25 MED ORDER — METOPROLOL SUCCINATE ER 50 MG PO TB24: 50.0000 mg | ORAL_TABLET | Freq: Every day | ORAL | 3 refills | Status: DC

## 2019-07-25 MED ORDER — LISINOPRIL 20 MG PO TABS: 20.0000 mg | ORAL_TABLET | Freq: Every day | ORAL | 3 refills | Status: DC

## 2019-07-25 MED ORDER — DOXAZOSIN MESYLATE 4 MG PO TABS: 4.0000 mg | ORAL_TABLET | Freq: Every day | ORAL | 3 refills | Status: DC

## 2019-07-25 MED ORDER — ATORVASTATIN CALCIUM 80 MG PO TABS: 80.0000 mg | ORAL_TABLET | Freq: Every day | ORAL | 3 refills | Status: DC

## 2019-07-25 NOTE — Telephone Encounter (Signed)
The patient calls requesting refills on his medications. Scheduled patient for 1 year visit in December and sent in prescriptions. He was grateful for assistance.

## 2019-07-25 NOTE — Addendum Note (Signed)
Addended by: Harland German A on: 07/25/2019 01:31 PM   Modules accepted: Orders

## 2019-07-25 NOTE — Telephone Encounter (Signed)
New Message   Pt is calling and is asking for Valetta Fuller to call him  Pt did not state what call was in regards too    Please call

## 2019-07-28 ENCOUNTER — Telehealth: Payer: Self-pay | Admitting: Emergency Medicine

## 2019-07-28 NOTE — Telephone Encounter (Signed)
Patient is calling because he received a call from [facility] stating his refills were ready but he no longer uses [facility]. Refills needed to be sent to [facility] for the following prescriptions.   atorvastatin (LIPITOR) 80 MG tablet  doxazosin (CARDURA) 4 MG tablet  lisinopril (ZESTRIL) 20 MG tablet metoprolol succinate (TOPROL-XL) 50 MG 24 hr tablet

## 2019-07-28 NOTE — Telephone Encounter (Signed)
Called pt to inform him that his medications were already sent to his preferred pharmacy as requested. I advised the pt that if he has any other problems, questions or concerns, to give our office a call. Pt verbalized understanding.

## 2019-07-30 ENCOUNTER — Encounter: Payer: Self-pay | Admitting: Internal Medicine

## 2019-09-25 ENCOUNTER — Encounter: Payer: BC Managed Care – PPO | Admitting: Internal Medicine

## 2019-10-20 ENCOUNTER — Telehealth: Payer: Self-pay

## 2019-10-20 NOTE — Telephone Encounter (Signed)
Called pt to reschedule colon and he would like to wait and reschedule when Dr. Carlean Purl is scoping again. Appts cancelled and pt will be contacted when colon can be rescheduled with Dr. Carlean Purl.

## 2019-11-06 ENCOUNTER — Encounter: Payer: BC Managed Care – PPO | Admitting: Internal Medicine

## 2020-01-21 ENCOUNTER — Ambulatory Visit: Payer: BC Managed Care – PPO | Admitting: Cardiovascular Disease

## 2020-03-31 ENCOUNTER — Encounter: Payer: Self-pay | Admitting: Internal Medicine

## 2020-05-07 ENCOUNTER — Encounter: Payer: Self-pay | Admitting: Cardiovascular Disease

## 2020-05-07 ENCOUNTER — Other Ambulatory Visit: Payer: Self-pay

## 2020-05-07 ENCOUNTER — Ambulatory Visit: Payer: BC Managed Care – PPO | Admitting: Cardiovascular Disease

## 2020-05-07 VITALS — BP 110/84 | HR 91 | Ht 74.0 in | Wt 265.8 lb

## 2020-05-07 DIAGNOSIS — I10 Essential (primary) hypertension: Secondary | ICD-10-CM

## 2020-05-07 DIAGNOSIS — I2510 Atherosclerotic heart disease of native coronary artery without angina pectoris: Secondary | ICD-10-CM

## 2020-05-07 DIAGNOSIS — E782 Mixed hyperlipidemia: Secondary | ICD-10-CM | POA: Diagnosis not present

## 2020-05-07 NOTE — Progress Notes (Signed)
Cardiology Office Note:    Date:  05/07/2020   ID:  Tim Edwards, DOB 1965/11/02, MRN [id number]  PCP:  Tim Sake, MD   [place]  Cardiologist:  Sherren Mocha, MD  Advanced Practice Provider:  No care team member to display Electrophysiologist:  None       Referring MD: Tim Sake, MD   Chief Complaint  Patient presents with  . Coronary Artery Disease    History of Present Illness:    Tim Edwards is a 55 y.o. male with a hx of coronary artery disease, presenting for follow-up evaluation.  The patient presented in 2010 with non-STEMI and was noted to have severe stenosis in his LAD, treated with PCI using a drug-eluting stent.  His LV function has been normal with an LVEF of 55%.  He has had no interval cardiac problems and has maintained a good medical regimen.  In review of his cardiac catheterization report, he was noted to have severe stenosis of the proximal LAD and was treated with a 3.0 x 23 mm Xience DES postdilated with a 3.5 mm noncompliant balloon.  He also was noted to have subtotal occlusion of the left PDA with the lesion unfavorable for PCI.  His RCA was small and nondominant.  He was not found to have any other obstructive disease.  LVEF was 55%.  He is here alone today.  He has had a few episodes of burning in his chest that have occurred with different physical activity such as running up and down the stairs or pushing a cart through [employer].  He has done some more vigorous activity such as push mowing his yard without symptoms.  No resting chest pain or pressure.  No resting chest burning.  No edema, heart palpitations, shortness of breath, diaphoresis, orthopnea, or PND.  Past Medical History:  Diagnosis Date  . CAD (coronary artery disease)    a. s/p Xience DES to LAD 4/10;  b. cath 05/28/08: pLAD 90% (tx with DES); left PDA occluded, RCA ok; EF 55%  . Dyslipidemia   . Headache   . Hyperlipidemia   . Hypertension   .  Personal history of colonic polyps - tubular adenomas 08/27/2013    Past Surgical History:  Procedure Laterality Date  . COLONOSCOPY    . CORONARY ANGIOPLASTY WITH STENT PLACEMENT    . VASECTOMY      Current Medications: Current Meds  Medication Sig  . allopurinol (ZYLOPRIM) 300 MG tablet Take 300 mg by mouth daily.   Marland Kitchen aspirin EC 81 MG EC tablet Take 1 tablet (81 mg total) by mouth daily.  Marland Kitchen atorvastatin (LIPITOR) 80 MG tablet Take 1 tablet (80 mg total) by mouth daily.  Marland Kitchen doxazosin (CARDURA) 4 MG tablet Take 1 tablet (4 mg total) by mouth daily.  . famotidine (PEPCID) 40 MG tablet Take 40 mg by mouth daily.  Marland Kitchen lisinopril (ZESTRIL) 20 MG tablet Take 1 tablet (20 mg total) by mouth daily.  . metoprolol succinate (TOPROL-XL) 50 MG 24 hr tablet Take 1 tablet (50 mg total) by mouth daily. Take with or immediately following a meal.  . pantoprazole (PROTONIX) 40 MG tablet Take 40 mg by mouth daily.     Allergies:   Patient has no known allergies.   Social History   Socioeconomic History  . Marital status: Married    Spouse name: Not on file  . Number of children: Not on file  . Years of education:  Not on file  . Highest education level: Not on file  Occupational History  . Not on file  Tobacco Use  . Smoking status: Current Every Day Smoker    Packs/day: 1.00    Years: 20.00    Pack years: 20.00    Types: Cigarettes  . Smokeless tobacco: Never Used  Substance and Sexual Activity  . Alcohol use: Yes    Comment: socially  . Drug use: No  . Sexual activity: Not on file  Other Topics Concern  . Not on file  Social History Narrative   HE lives in [place] with his wife. He works at [employer] in [employer] . He smokes 2 packs of cigarettes a day, has done for 20 years. He drinks socially. Denies any recreational substance use. No diet restrictions. Absolutely no exercise per patient.   Social Determinants of Health   Financial Resource Strain: Not on file  Food  Insecurity: Not on file  Transportation Needs: Not on file  Physical Activity: Not on file  Stress: Not on file  Social Connections: Not on file     Family History: The patient's family history includes Aneurysm in his mother; Cancer in his father; Colon cancer in his mother; Crohn's disease in his brother. There is no history of Esophageal cancer, Stomach cancer, or Rectal cancer.  ROS:   Please see the history of present illness.    All other systems reviewed and are negative.  EKGs/Labs/Other Studies Reviewed:    The following studies were reviewed today: Cardiac catheterization report from 2010 reviewed as outlined in the HPI  EKG:  EKG is ordered today.  The ekg ordered today demonstrates normal sinus rhythm 91 bpm, within normal limits.  Recent Labs: No results found for requested labs within last 8760 hours.  Recent Lipid Panel    Component Value Date/Time   CHOL 120 11/09/2014 0754   TRIG 71.0 11/09/2014 0754   HDL 34.50 (L) 11/09/2014 0754   CHOLHDL 3 11/09/2014 0754   VLDL 14.2 11/09/2014 0754   LDLCALC 71 11/09/2014 0754     Risk Assessment/Calculations:       Physical Exam:    VS:  BP 110/84   Pulse 91   Ht 6\' 2"  (1.88 m)   Wt 265 lb 12.8 oz (120.6 kg)   SpO2 96%   BMI 34.13 kg/m     Wt Readings from Last 3 Encounters:  05/07/20 265 lb 12.8 oz (120.6 kg)  01/17/19 257 lb (116.6 kg)  06/26/18 248 lb (112.5 kg)     GEN:  Well nourished, well developed in no acute distress HEENT: Normal NECK: No JVD; No carotid bruits LYMPHATICS: No lymphadenopathy CARDIAC: RRR, no murmurs, rubs, gallops RESPIRATORY:  Clear to auscultation without rales, wheezing or rhonchi  ABDOMEN: Soft, non-tender, non-distended MUSCULOSKELETAL:  No edema; No deformity  SKIN: Warm and dry NEUROLOGIC:  Alert and oriented x 3 PSYCHIATRIC:  Normal affect   ASSESSMENT:    1. Coronary artery disease involving native coronary artery of native heart without angina pectoris    2. Essential hypertension, benign   3. Mixed hyperlipidemia    PLAN:    In order of problems listed above:  1. The patient has symptoms that are concerning for recurrent angina.  I have recommended a Myoview stress test for further evaluation.  He will continue on aspirin, atorvastatin, and metoprolol succinate.  We will adjust his medical program as needed.  We will follow-up with him as soon as his  stress test results are available. 2. Blood pressure controlled on lisinopril and metoprolol succinate.  Discussed weight loss.  His weight is up 30 pounds over the last 4 years.  He will refocus efforts at exercise and weight loss. 3. Treated with atorvastatin 80 mg daily.  He is going to send [place] his lab work from primary care.  I cannot access it through our medical records were through care everywhere.  Lifestyle modification discussed as above.   Shared Decision Making/Informed Consent The risks [chest pain, shortness of breath, cardiac arrhythmias, dizziness, blood pressure fluctuations, myocardial infarction, stroke/transient ischemic attack, nausea, vomiting, allergic reaction, radiation exposure, metallic taste sensation and life-threatening complications (estimated to be 1 in 10,000)], benefits (risk stratification, diagnosing coronary artery disease, treatment guidance) and alternatives of a nuclear stress test were discussed in detail with Tim Edwards and he agrees to proceed.  Medication Adjustments/Labs and Tests Ordered: Current medicines are reviewed at length with the patient today.  Concerns regarding medicines are outlined above.  Orders Placed This Encounter  Procedures  . MYOCARDIAL PERFUSION IMAGING  . EKG 12-Lead   No orders of the defined types were placed in this encounter.   Patient Instructions  Medication Instructions:  Your provider recommends that you continue on your current medications as directed. Please refer to the Current Medication list given to you today.    *If you need a refill on your cardiac medications before your next appointment, please call your pharmacy*  Testing/Procedures: Your provider has requested that you have a lexiscan myoview. For further information please visit [email or web address]. Please follow instruction sheet, as given.  Follow-Up: At [facility], you and your health needs are our priority.  As part of our continuing mission to provide you with exceptional heart care, we have created designated Provider Care Teams.  These Care Teams include your primary Cardiologist (physician) and Advanced Practice Providers (APPs -  Physician Assistants and Nurse Practitioners) who all work together to provide you with the care you need, when you need it. Your next appointment:   12 month(s) The format for your next appointment:   In Person Provider:   You may see Sherren Mocha, MD or one of the following Advanced Practice Providers on your designated Care Team:    Richardson Dopp, PA-C  Robbie Lis, [place]      Signed, Sherren Mocha, MD  05/07/2020 10:27 PM    [place]

## 2020-05-07 NOTE — Patient Instructions (Addendum)
Medication Instructions:  Your provider recommends that you continue on your current medications as directed. Please refer to the Current Medication list given to you today.   *If you need a refill on your cardiac medications before your next appointment, please call your pharmacy*  Testing/Procedures: Your provider has requested that you have a lexiscan myoview. For further information please visit [email or web address]. Please follow instruction sheet, as given.  Follow-Up: At [facility], you and your health needs are our priority.  As part of our continuing mission to provide you with exceptional heart care, we have created designated Provider Care Teams.  These Care Teams include your primary Cardiologist (physician) and Advanced Practice Providers (APPs -  Physician Assistants and Nurse Practitioners) who all work together to provide you with the care you need, when you need it. Your next appointment:   12 month(s) The format for your next appointment:   In Person Provider:   You may see Sherren Mocha, MD or one of the following Advanced Practice Providers on your designated Care Team:    Richardson Dopp, PA-C  [place], [place]

## 2020-05-18 ENCOUNTER — Telehealth: Payer: Self-pay

## 2020-05-18 DIAGNOSIS — I25118 Atherosclerotic heart disease of native coronary artery with other forms of angina pectoris: Secondary | ICD-10-CM

## 2020-05-18 NOTE — Telephone Encounter (Signed)
Attestation ordered for Dr. Burt Knack to sign.

## 2020-05-26 ENCOUNTER — Telehealth (HOSPITAL_COMMUNITY): Payer: Self-pay

## 2020-05-26 NOTE — Telephone Encounter (Signed)
Detailed instructions left on the patient's answer machine. Asked to call back with any questions. S.Shary Lamos EMTP

## 2020-05-27 ENCOUNTER — Ambulatory Visit (HOSPITAL_COMMUNITY): Payer: BC Managed Care – PPO | Attending: Cardiology

## 2020-05-27 ENCOUNTER — Other Ambulatory Visit: Payer: Self-pay

## 2020-05-27 DIAGNOSIS — I2510 Atherosclerotic heart disease of native coronary artery without angina pectoris: Secondary | ICD-10-CM | POA: Insufficient documentation

## 2020-05-27 DIAGNOSIS — E782 Mixed hyperlipidemia: Secondary | ICD-10-CM | POA: Insufficient documentation

## 2020-05-27 DIAGNOSIS — I10 Essential (primary) hypertension: Secondary | ICD-10-CM | POA: Diagnosis not present

## 2020-05-27 LAB — MYOCARDIAL PERFUSION IMAGING
LV dias vol: 116 mL (ref 62–150)
LV sys vol: 49 mL
Peak HR: 92 {beats}/min
Rest HR: 75 {beats}/min
SDS: 3
SRS: 3
SSS: 6
TID: 1.32

## 2020-05-27 MED ORDER — REGADENOSON 0.4 MG/5ML IV SOLN
0.4000 mg | Freq: Once | INTRAVENOUS | Status: AC
  Administered 2020-05-27: 0.4 mg via INTRAVENOUS

## 2020-05-27 MED ORDER — TECHNETIUM TC 99M TETROFOSMIN IV KIT
31.5000 | PACK | Freq: Once | INTRAVENOUS | Status: AC | PRN
  Administered 2020-05-27: 31.5 via INTRAVENOUS
  Filled 2020-05-27: qty 32

## 2020-05-27 MED ORDER — TECHNETIUM TC 99M TETROFOSMIN IV KIT
10.4000 | PACK | Freq: Once | INTRAVENOUS | Status: AC | PRN
  Administered 2020-05-27: 10.4 via INTRAVENOUS
  Filled 2020-05-27: qty 11

## 2020-05-31 ENCOUNTER — Telehealth: Payer: Self-pay | Admitting: Nurse Practitioner

## 2020-05-31 ENCOUNTER — Telehealth: Payer: Self-pay | Admitting: Cardiovascular Disease

## 2020-05-31 MED ORDER — NITROGLYCERIN 0.4 MG SL SUBL: 0.4000 mg | SUBLINGUAL_TABLET | SUBLINGUAL | 6 refills | Status: AC | PRN

## 2020-05-31 NOTE — Telephone Encounter (Signed)
Called patient regarding lab results that were faxed to [place]. States PCP is repeating cholesterol test in a month due to elevated LDL. He has been working on eating heart healthy diet. He also asked me to review his stress test. He reports he continues to have chest burning with radiation to his right arm. He described this to Dr. Burt Knack at [facility] on 4/1 and nuclear stress was ordered. He states symptoms continue to occur intermittently, sometimes with activity and sometimes at rest. Reports pain lasts for approximately 1 minute. No fatigue, SOB, diaphoresis, n/v. Did yard work for 2 hours yesterday without symptoms. Occasionally goes days without noticing it. Denies orthopnea, pnd, syncope, palpitations. He does not have sl ntg on hand. He previously stopped the medication he was given for reflux but resumed it today (did not take it long enough to determine if it offered symptom relief). I advised that I will forward message to Dr. Burt Knack for advice and that someone from our office will call him back with his advice.

## 2020-05-31 NOTE — Telephone Encounter (Signed)
Patient returning call form stress test results.

## 2020-05-31 NOTE — Telephone Encounter (Signed)
-----   Message from Sherren Mocha, MD sent at 05/30/2020 10:10 PM EDT ----- Study result looks good. Artifact noted. Normal cardiac function and no ischemia noted. Continue current medical therapy. thanks

## 2020-05-31 NOTE — Telephone Encounter (Signed)
See additional telephone encounter dated 4/25

## 2020-06-01 NOTE — Telephone Encounter (Signed)
The patient was called with results yesterday.

## 2020-06-10 ENCOUNTER — Ambulatory Visit: Payer: BC Managed Care – PPO

## 2020-06-10 ENCOUNTER — Other Ambulatory Visit: Payer: Self-pay

## 2020-06-10 VITALS — Ht 74.0 in | Wt 240.0 lb

## 2020-06-10 DIAGNOSIS — Z1211 Encounter for screening for malignant neoplasm of colon: Secondary | ICD-10-CM

## 2020-06-10 DIAGNOSIS — Z800 Family history of malignant neoplasm of digestive organs: Secondary | ICD-10-CM

## 2020-06-10 NOTE — Progress Notes (Signed)
No allergies to soy or egg Pt is not on blood thinners or diet pills Denies issues with sedation/intubation Denies atrial flutter/fib Denies constipation   Emmi instructions given to pt  Pt is aware of Covid safety and care partner requirements.   Pt verified name, DOB, address and insurance during PV today. copy of consent form to read and not return, and instructions.  Pt encouraged to call with questions or issues.

## 2020-06-21 ENCOUNTER — Encounter: Payer: Self-pay | Admitting: Internal Medicine

## 2020-06-24 ENCOUNTER — Other Ambulatory Visit: Payer: Self-pay

## 2020-06-24 ENCOUNTER — Ambulatory Visit (AMBULATORY_SURGERY_CENTER): Payer: BC Managed Care – PPO | Admitting: Internal Medicine

## 2020-06-24 ENCOUNTER — Encounter: Payer: Self-pay | Admitting: Internal Medicine

## 2020-06-24 VITALS — BP 107/71 | HR 66 | Temp 96.4°F | Resp 12 | Ht 74.0 in | Wt 240.0 lb

## 2020-06-24 DIAGNOSIS — D120 Benign neoplasm of cecum: Secondary | ICD-10-CM | POA: Diagnosis not present

## 2020-06-24 DIAGNOSIS — Z86010 Personal history of colonic polyps: Secondary | ICD-10-CM | POA: Diagnosis not present

## 2020-06-24 DIAGNOSIS — D123 Benign neoplasm of transverse colon: Secondary | ICD-10-CM | POA: Diagnosis not present

## 2020-06-24 DIAGNOSIS — Z1211 Encounter for screening for malignant neoplasm of colon: Secondary | ICD-10-CM | POA: Diagnosis not present

## 2020-06-24 DIAGNOSIS — D125 Benign neoplasm of sigmoid colon: Secondary | ICD-10-CM | POA: Diagnosis not present

## 2020-06-24 DIAGNOSIS — Z800 Family history of malignant neoplasm of digestive organs: Secondary | ICD-10-CM

## 2020-06-24 MED ORDER — SODIUM CHLORIDE 0.9 % IV SOLN: 500.0000 mL | Freq: Once | INTRAVENOUS | Status: DC

## 2020-06-24 NOTE — Patient Instructions (Addendum)
Three tiny polyps removed - all look benign. Not to worry.  Expect repeat in 5 years - await pathology review to decide.  You still  have diverticulosis - thickened muscle rings and pouches in the colon wall. Please read the handout about this condition.  I appreciate the opportunity to care for you. Gatha Mayer, MD, [facility]  Please read handouts provided. Continue present medications. Await pathology results.   YOU HAD AN ENDOSCOPIC PROCEDURE TODAY AT [facility]:   Refer to the procedure report that was given to you for any specific questions about what was found during the examination.  If the procedure report does not answer your questions, please call your gastroenterologist to clarify.  If you requested that your care partner not be given the details of your procedure findings, then the procedure report has been included in a sealed envelope for you to review at your convenience later.  YOU SHOULD EXPECT: Some feelings of bloating in the abdomen. Passage of more gas than usual.  Walking can help get rid of the air that was put into your GI tract during the procedure and reduce the bloating. If you had a lower endoscopy (such as a colonoscopy or flexible sigmoidoscopy) you may notice spotting of blood in your stool or on the toilet paper. If you underwent a bowel prep for your procedure, you may not have a normal bowel movement for a few days.  Please Note:  You might notice some irritation and congestion in your nose or some drainage.  This is from the oxygen used during your procedure.  There is no need for concern and it should clear up in a day or so.  SYMPTOMS TO REPORT IMMEDIATELY:   Following lower endoscopy (colonoscopy or flexible sigmoidoscopy):  Excessive amounts of blood in the stool  Significant tenderness or worsening of abdominal pains  Swelling of the abdomen that is new, acute  Fever of 100F or higher   For urgent or emergent issues, a  gastroenterologist can be reached at any hour by calling [phone]. Do not use MyChart messaging for urgent concerns.    DIET:  We do recommend a small meal at first, but then you may proceed to your regular diet.  Drink plenty of fluids but you should avoid alcoholic beverages for 24 hours.  ACTIVITY:  You should plan to take it easy for the rest of today and you should NOT DRIVE or use heavy machinery until tomorrow (because of the sedation medicines used during the test).    FOLLOW UP: Our staff will call the number listed on your records 48-72 hours following your procedure to check on you and address any questions or concerns that you may have regarding the information given to you following your procedure. If we do not reach you, we will leave a message.  We will attempt to reach you two times.  During this call, we will ask if you have developed any symptoms of COVID 19. If you develop any symptoms (ie: fever, flu-like symptoms, shortness of breath, cough etc.) before then, please call [phone].  If you test positive for Covid 19 in the 2 weeks post procedure, please call and report this information to [place].    If any biopsies were taken you will be contacted by phone or by letter within the next 1-3 weeks.  Please call us at [phone] if you have not heard about the biopsies in 3 weeks.    SIGNATURES/CONFIDENTIALITY: You  and/or your care partner have signed paperwork which will be entered into your electronic medical record.  These signatures attest to the fact that that the information above on your After Visit Summary has been reviewed and is understood.  Full responsibility of the confidentiality of this discharge information lies with you and/or your care-partner.

## 2020-06-24 NOTE — Progress Notes (Signed)
Pt's states no medical or surgical changes since previsit or office visit. 

## 2020-06-24 NOTE — Progress Notes (Signed)
pt tolerated well. VSS. awake and to recovery. Report given to RN.  

## 2020-06-24 NOTE — Op Note (Addendum)
[place] Patient Name: Tim Edwards Procedure Date: 06/24/2020 8:17 AM MRN: [id number] Endoscopist: Gatha Mayer , MD Age: 55 Referring MD:  Date of Birth: Jun 08, 1965 Gender: Male Account #: [id number] Procedure:                Colonoscopy Medicines:                Propofol per Anesthesia, Monitored Anesthesia Care Procedure:                Pre-Anesthesia Assessment:                           - Prior to the procedure, a History and Physical                            was performed, and patient medications and                            allergies were reviewed. The patient's tolerance of                            previous anesthesia was also reviewed. The risks                            and benefits of the procedure and the sedation                            options and risks were discussed with the patient.                            All questions were answered, and informed consent                            was obtained. Prior Anticoagulants: The patient has                            taken no previous anticoagulant or antiplatelet                            agents. ASA Grade Assessment: II - A patient with                            mild systemic disease. After reviewing the risks                            and benefits, the patient was deemed in                            satisfactory condition to undergo the procedure.                           After obtaining informed consent, the colonoscope                            was passed under direct vision. Throughout  the                            procedure, the patient's blood pressure, pulse, and                            oxygen saturations were monitored continuously. The                            Olympus CF-HQ190L ([id number]) Colonoscope was                            introduced through the anus and advanced to the the                            cecum, identified by appendiceal orifice and                             ileocecal valve. The colonoscopy was performed                            without difficulty. The patient tolerated the                            procedure well. The quality of the bowel                            preparation was good. The bowel preparation used                            was Miralax via split dose instruction. The                            ileocecal valve, appendiceal orifice, and rectum                            were photographed. Scope In: 8:45:38 AM Scope Out: 8:58:36 AM Scope Withdrawal Time: 0 hours 11 minutes 24 seconds  Total Procedure Duration: 0 hours 12 minutes 58 seconds  Findings:                 The perianal and digital rectal examinations were                            normal. Pertinent negatives include normal prostate                            (size, shape, and consistency).                           Two sessile polyps were found in the sigmoid colon                            and transverse colon. The polyps were 3 to 5 mm in  size. These polyps were removed with a cold snare.                            Resection and retrieval were complete. Verification                            of patient identification for the specimen was                            done. Estimated blood loss was minimal.                           A 1 mm polyp was found in the cecum. The polyp was                            sessile. The polyp was removed with a cold biopsy                            forceps. Resection and retrieval were complete.                            Verification of patient identification for the                            specimen was done. Estimated blood loss was minimal.                           Multiple small and large-mouthed diverticula were                            found in the sigmoid colon, descending colon,                            transverse colon and ascending colon.                           The exam was  otherwise without abnormality on                            direct and retroflexion views. Complications:            No immediate complications. Estimated Blood Loss:     Estimated blood loss was minimal. Impression:               - Two 3 to 5 mm polyps in the sigmoid colon and in                            the transverse colon, removed with a cold snare.                            Resected and retrieved.                           - One 1 mm polyp in the  cecum, removed with a cold                            biopsy forceps. Resected and retrieved.                           - Diverticulosis in the sigmoid colon, in the                            descending colon, in the transverse colon and in                            the ascending colon.                           - The examination was otherwise normal on direct                            and retroflexion views.                           - Personal history of colonic polyps 2 adenopmas                            2015 max 12 mm. Mother w/ colon cancer also.Marland Kitchen Recommendation:           - Patient has a contact number available for                            emergencies. The signs and symptoms of potential                            delayed complications were discussed with the                            patient. Return to normal activities tomorrow.                            Written discharge instructions were provided to the                            patient.                           - Resume previous diet.                           - Continue present medications.                           - Repeat colonoscopy is recommended for                            surveillance. The colonoscopy date will be  determined after pathology results from today's                            exam become available for review. Gatha Mayer, MD 06/24/2020 9:08:28 AM This report has been signed electronically. Addendum Number: 1    Addendum Date: 07/08/2020 9:21:50 AM      Indication for procedue:      Personal hiostory of adenomatous colon polyps Gatha Mayer, MD 07/08/2020 9:22:11 AM This report has been signed electronically.

## 2020-06-24 NOTE — Progress Notes (Signed)
Called to room to assist during endoscopic procedure.  Patient ID and intended procedure confirmed with present staff. Received instructions for my participation in the procedure from the performing physician.  

## 2020-06-28 ENCOUNTER — Telehealth: Payer: Self-pay

## 2020-06-28 NOTE — Telephone Encounter (Signed)
  Follow up Call-  Call back number 06/24/2020  Post procedure Call Back phone  # [phone]  Permission to leave phone message Yes  Some recent data might be hidden     Patient questions:  Do you have a fever, pain , or abdominal swelling? No. Pain Score  0 *  Have you tolerated food without any problems? Yes.    Have you been able to return to your normal activities? Yes.    Do you have any questions about your discharge instructions: Diet   No. Medications  No. Follow up visit  No.  Do you have questions or concerns about your Care? Yes.  Pt. Wondered if he would hear the pathology results of his polyps if not pre-cancerous.  Told pt. He would be notified regardless of the outcome through a letter or MyChart.    Actions: * If pain score is 4 or above: No action needed, pain <4.

## 2020-06-28 NOTE — Telephone Encounter (Signed)
  Follow up Call-  Call back number 06/24/2020  Post procedure Call Back phone  # [phone]  Permission to leave phone message Yes  Some recent data might be hidden     1st follow up call made. NALM

## 2020-07-03 ENCOUNTER — Encounter: Payer: Self-pay | Admitting: Internal Medicine

## 2020-07-22 NOTE — Telephone Encounter (Signed)
Called to touch base with patient. Left message to call back if he is still having problems/concerns.

## 2020-07-26 ENCOUNTER — Other Ambulatory Visit: Payer: Self-pay | Admitting: Cardiovascular Disease

## 2020-09-01 ENCOUNTER — Other Ambulatory Visit: Payer: Self-pay | Admitting: Cardiovascular Disease

## 2020-10-11 DIAGNOSIS — N481 Balanitis: Secondary | ICD-10-CM | POA: Diagnosis not present

## 2020-10-12 ENCOUNTER — Other Ambulatory Visit: Payer: Self-pay | Admitting: Cardiovascular Disease

## 2020-10-18 DIAGNOSIS — Z1389 Encounter for screening for other disorder: Secondary | ICD-10-CM | POA: Diagnosis not present

## 2021-05-31 DIAGNOSIS — H903 Sensorineural hearing loss, bilateral: Secondary | ICD-10-CM | POA: Diagnosis not present

## 2021-06-06 ENCOUNTER — Telehealth: Payer: Self-pay

## 2021-06-06 NOTE — Telephone Encounter (Signed)
error 

## 2021-06-07 ENCOUNTER — Encounter: Payer: Self-pay | Admitting: Cardiovascular Disease

## 2021-06-09 ENCOUNTER — Ambulatory Visit: Payer: BC Managed Care – PPO | Admitting: Cardiovascular Disease

## 2021-06-09 ENCOUNTER — Encounter: Payer: Self-pay | Admitting: Cardiovascular Disease

## 2021-06-09 VITALS — BP 120/80 | HR 72 | Ht 74.0 in | Wt 255.2 lb

## 2021-06-09 DIAGNOSIS — E782 Mixed hyperlipidemia: Secondary | ICD-10-CM | POA: Diagnosis not present

## 2021-06-09 DIAGNOSIS — I25118 Atherosclerotic heart disease of native coronary artery with other forms of angina pectoris: Secondary | ICD-10-CM | POA: Diagnosis not present

## 2021-06-09 DIAGNOSIS — I10 Essential (primary) hypertension: Secondary | ICD-10-CM | POA: Diagnosis not present

## 2021-06-09 NOTE — Progress Notes (Signed)
?Cardiology Office Note:   ? ?Date:  06/09/2021  ? ?ID:  Tim Edwards, DOB 09-02-1965, MRN [id number] ? ?PCP:  Geannie Risen, MD ?  ?[facility] ?Cardiologist:  Sherren Mocha, MD    ? ?Referring MD: Geannie Risen, MD  ? ?Chief Complaint  ?Patient presents with  ? Coronary Artery Disease  ? ? ?History of Present Illness:   ? ?Tim Edwards is a 56 y.o. male with a hx of coronary artery disease, presenting for follow-up evaluation.  The patient presented in 2010 with non-STEMI and was noted to have severe stenosis in his LAD, treated with PCI using a drug-eluting stent.  His LV function has been normal with an LVEF of 55%.  He has had no interval cardiac problems and has maintained a good medical regimen.  In review of his cardiac catheterization report, he was noted to have severe stenosis of the proximal LAD and was treated with a 3.0 x 23 mm Xience DES postdilated with a 3.5 mm noncompliant balloon.  He also was noted to have subtotal occlusion of the left PDA with the lesion unfavorable for PCI.  His RCA was small and nondominant.  He was not found to have any other obstructive disease.  LVEF was 55%.  At the time of last year's visit, he complained of chest discomfort that was concerning for recurrent angina.  A stress Myoview scan was performed and demonstrated no significant ischemia with normal LVEF.  The study result was low risk and the only abnormality was 1 of attenuation artifact.  He presents today for follow-up evaluation. ? ?The patient is here alone today.  He is doing well.  He is playing pickle ball for exercise.  He denies chest pain, chest pressure, or shortness of breath.  No heart palpitations or leg swelling.  Overall he feels well and has no cardiac-related concerns today. ? ?Past Medical History:  ?Diagnosis Date  ? CAD (coronary artery disease)   ? a. s/p Xience DES to LAD 4/10;  b. cath 05/28/08: pLAD 90% (tx with DES); left PDA occluded, RCA ok; EF 55%  ? Dyslipidemia    ? Headache   ? Hyperlipidemia   ? Hypertension   ? Personal history of colonic polyps - tubular adenomas 08/27/2013  ? ? ?Past Surgical History:  ?Procedure Laterality Date  ? COLONOSCOPY    ? CORONARY ANGIOPLASTY WITH STENT PLACEMENT    ? VASECTOMY    ? ? ?Current Medications: ?Current Meds  ?Medication Sig  ? allopurinol (ZYLOPRIM) 300 MG tablet Take 300 mg by mouth daily.   ? aspirin EC 81 MG EC tablet Take 1 tablet (81 mg total) by mouth daily.  ? atorvastatin (LIPITOR) 80 MG tablet TAKE ONE TABLET BY MOUTH DAILY  ? doxazosin (CARDURA) 4 MG tablet TAKE ONE TABLET BY MOUTH DAILY  ? lisinopril (ZESTRIL) 20 MG tablet TAKE ONE TABLET BY MOUTH DAILY  ? metoprolol succinate (TOPROL-XL) 50 MG 24 hr tablet TAKE ONE TABLET BY MOUTH DAILY WITH OR IMMEDIATELY FOLLOWING A MEAL  ? Multiple Vitamin (MULTIVITAMIN PO) Take by mouth.  ? nitroGLYCERIN (NITROSTAT) 0.4 MG SL tablet Place 1 tablet (0.4 mg total) under the tongue every 5 (five) minutes as needed for chest pain.  ? pantoprazole (PROTONIX) 40 MG tablet Take 40 mg by mouth daily.  ? testosterone cypionate (DEPOTESTOSTERONE CYPIONATE) 200 MG/ML injection SMARTSIG:0.5 Milliliter(s) IM Every 2 Weeks  ? [DISCONTINUED] famotidine (PEPCID) 40 MG tablet Take 40 mg by mouth daily.  ?  ? ?  Allergies:   Patient has no known allergies.  ? ?Social History  ? ?Socioeconomic History  ? Marital status: Married  ?  Spouse name: Not on file  ? Number of children: Not on file  ? Years of education: Not on file  ? Highest education level: Not on file  ?Occupational History  ? Not on file  ?Tobacco Use  ? Smoking status: Former  ?  Packs/day: 1.00  ?  Years: 20.00  ?  Pack years: 20.00  ?  Types: Cigarettes  ? Smokeless tobacco: Never  ?Vaping Use  ? Vaping Use: Every day  ?Substance and Sexual Activity  ? Alcohol use: Yes  ?  Comment: socially  ? Drug use: No  ? Sexual activity: Not on file  ?Other Topics Concern  ? Not on file  ?Social History Narrative  ? HE lives in [place] with his  wife. Tree surgeon [name]. . Former smoker  He drinks socially. Denies any recreational substance use. No diet restrictions.   ? ?Social Determinants of Health  ? ?Financial Resource Strain: Not on file  ?Food Insecurity: Not on file  ?Transportation Needs: Not on file  ?Physical Activity: Not on file  ?Stress: Not on file  ?Social Connections: Not on file  ?  ? ?Family History: ?The patient's family history includes Aneurysm in his mother; Cancer in his father; Colon cancer in his mother; Crohn's disease in his brother. There is no history of Esophageal cancer, Stomach cancer, Rectal cancer, or Colon polyps. ? ?ROS:   ?Please see the history of present illness.    ?All other systems reviewed and are negative. ? ?EKGs/Labs/Other Studies Reviewed:   ? ?The following studies were reviewed today: ?Stress Myoview scan 05/27/2020: ?The left ventricular ejection fraction is normal (55-65%). ?Nuclear stress EF: 58%. ?There was no ST segment deviation noted during stress. ?There is a medium defect of moderate severity present in the mid anteroseptal, apical inferior and apical lateral location. The defect is non-reversible. and in the setting of normal wall motion, is most consistent with diaphragmatic attenuation artifact and extracardiac uptake. No ischemia noted. ?This is a low risk study. ? ?EKG:  EKG is ordered today.  The ekg ordered today demonstrates NSR 70 bpm,  ? ?Recent Labs: ?No results found for requested labs within last 8760 hours.  ?Recent Lipid Panel ?   ?Component Value Date/Time  ? CHOL 120 11/09/2014 0754  ? TRIG 71.0 11/09/2014 0754  ? HDL 34.50 (L) 11/09/2014 0754  ? CHOLHDL 3 11/09/2014 0754  ? VLDL 14.2 11/09/2014 0754  ? [place] 71 11/09/2014 0754  ? ? ? ?Risk Assessment/Calculations:   ?  ? ?    ? ?Physical Exam:   ? ?VS:  BP 120/80   Pulse 72   Ht '6\' 2"'$  (1.88 m)   Wt 255 lb 3.2 oz (115.8 kg)   BMI 32.77 kg/m?    ? ?Wt Readings from Last 3 Encounters:  ?06/09/21 255 lb 3.2 oz (115.8 kg)   ?06/24/20 240 lb (108.9 kg)  ?06/10/20 240 lb (108.9 kg)  ?  ? ?GEN:  Well nourished, well developed in no acute distress ?HEENT: Normal ?NECK: No JVD; No carotid bruits ?LYMPHATICS: No lymphadenopathy ?CARDIAC: RRR, no murmurs, rubs, gallops ?RESPIRATORY:  Clear to auscultation without rales, wheezing or rhonchi  ?ABDOMEN: Soft, non-tender, non-distended ?MUSCULOSKELETAL:  No edema; No deformity  ?SKIN: Warm and dry ?NEUROLOGIC:  Alert and oriented x 3 ?PSYCHIATRIC:  Normal affect  ? ?ASSESSMENT:   ? ?  1. Coronary artery disease with exertional angina ([place])   ?2. Essential hypertension, benign   ?3. Mixed hyperlipidemia   ? ?PLAN:   ? ?In order of problems listed above: ? ?The patient has had no further chest discomfort since his evaluation last year.  He likely was experiencing gastroesophageal reflux disease and his symptoms have resolved on Protonix and famotidine.  He will continue on his current medical program without change.  He is treated with aspirin, high intensity statin drug, and ACE inhibitor, and beta-blocker. ?Blood pressure is well controlled on lisinopril and metoprolol succinate ?The patient brings in labs today.  His LDL cholesterol is 105, previously 94.  He is treated with atorvastatin 80 mg daily.  His goal LDL cholesterol is less than 70 mg/dL as he has stable ischemic heart disease with remote stenting.  I think he would benefit from much more potent lipid-lowering, ideally with an LDL less than 55 mg/dL.  I have recommended referral to the lipid clinic for consideration of a PCSK9 inhibitor.  He is willing to consider injectable therapies.  I will have his labs scanned in, but on my review today of recent labs from 06/03/2021, his creatinine is 1.1, potassium 5.2, AST 26, ALT 33, cholesterol 158, triglycerides 89, HDL 36, LDL 105, hemoglobin A1c 5.7. ? ?   ? ?   ? ? ?Medication Adjustments/Labs and Tests Ordered: ?Current medicines are reviewed at length with the patient today.  Concerns  regarding medicines are outlined above.  ?Orders Placed This Encounter  ?Procedures  ? AMB Referral to [facility] Pharm-D  ? EKG 12-Lead  ? ?No orders of the defined types were placed in this encounter. ? ?

## 2021-06-09 NOTE — Patient Instructions (Signed)
Medication Instructions:  ?Your physician recommends that you continue on your current medications as directed. Please refer to the Current Medication list given to you today. ? ?*If you need a refill on your cardiac medications before your next appointment, please call your pharmacy* ? ? ?Lab Work: ?NONE ?If you have labs (blood work) drawn today and your tests are completely normal, you will receive your results only by: ?MyChart Message (if you have MyChart) OR ?A paper copy in the mail ?If you have any lab test that is abnormal or we need to change your treatment, we will call you to review the results. ? ? ?Testing/Procedures: ?Referral to PharmD-Lipid Clinic ? ? ?Follow-Up: ?At [facility], you and your health needs are our priority.  As part of our continuing mission to provide you with exceptional heart care, we have created designated Provider Care Teams.  These Care Teams include your primary Cardiologist (physician) and Advanced Practice Providers (APPs -  Physician Assistants and Nurse Practitioners) who all work together to provide you with the care you need, when you need it. ? ?Your next appointment:   ?1 year(s) ? ?The format for your next appointment:   ?In Person ? ?Provider:   ?Sherren Mocha, MD   ? ?Important Information About Sugar ? ? ? ? ?  ?

## 2021-07-05 ENCOUNTER — Other Ambulatory Visit: Payer: Self-pay | Admitting: Cardiovascular Disease

## 2021-07-07 ENCOUNTER — Encounter: Payer: Self-pay | Admitting: Pharmacist

## 2021-07-07 ENCOUNTER — Ambulatory Visit (INDEPENDENT_AMBULATORY_CARE_PROVIDER_SITE_OTHER): Payer: BC Managed Care – PPO | Admitting: Pharmacist

## 2021-07-07 DIAGNOSIS — I2510 Atherosclerotic heart disease of native coronary artery without angina pectoris: Secondary | ICD-10-CM | POA: Diagnosis not present

## 2021-07-07 DIAGNOSIS — E785 Hyperlipidemia, unspecified: Secondary | ICD-10-CM

## 2021-07-07 MED ORDER — REPATHA SURECLICK 140 MG/ML ~~LOC~~ SOAJ: 1.0000 "pen " | SUBCUTANEOUS | 11 refills | Status: DC

## 2021-07-07 NOTE — Progress Notes (Signed)
Patient ID: [name]                 DOB: 09-26-1965                    MRN: [id number]     HPI: Tim Edwards is a 56 y.o. male patient referred to lipid clinic by Dr. Burt Knack. PMH is significant for CAD s/p NSTEMI in 2010, HTN and HLD. LDL-C in May was 105. Patient referred to lipid clinic for PCSK9i.  Patient presents today to clinic. He states he is ready to start PCSK9i. He works at [employer] [name] pays all the copays so he is not concerned about cost. Rx needs to be called into [facility].   Current Medications: atorvastatin '80mg'$  daily Risk Factors: premature CAD LDL goal: <55  Exercise: pickle ball  Family History:  Family History  Problem Relation Age of Onset   Cancer Father    Colon cancer Mother    Aneurysm Mother    Crohn's disease Brother    Esophageal cancer Neg Hx    Stomach cancer Neg Hx    Rectal cancer Neg Hx    Colon polyps Neg Hx     Social History:  Social History   Socioeconomic History   Marital status: Married    Spouse name: Not on file   Number of children: Not on file   Years of education: Not on file   Highest education level: Not on file  Occupational History   Not on file  Tobacco Use   Smoking status: Former    Packs/day: 1.00    Years: 20.00    Pack years: 20.00    Types: Cigarettes   Smokeless tobacco: Never  Vaping Use   Vaping Use: Every day  Substance and Sexual Activity   Alcohol use: Yes    Comment: socially   Drug use: No   Sexual activity: Not on file  Other Topics Concern   Not on file  Social History Narrative   HE lives in [place] 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 . Former smoker  He drinks socially. Denies any recreational substance use. No diet restrictions.    Social Determinants of Health   Financial Resource Strain: Not on file  Food Insecurity: Not on file  Transportation Needs: Not on file  Physical Activity: Not on file  Stress: Not on file  Social  Connections: Not on file  Intimate Partner Violence: Not on file     Labs: cholesterol 158, triglycerides 89, HDL 36, LDL 105 (atorvastatin '80mg'$ )  Past Medical History:  Diagnosis Date   CAD (coronary artery disease)    a. s/p Xience DES to LAD 4/10;  b. cath 05/28/08: pLAD 90% (tx with DES); left PDA occluded, RCA ok; EF 55%   Dyslipidemia    Headache    Hyperlipidemia    Hypertension    Personal history of colonic polyps - tubular adenomas 08/27/2013    Current Outpatient Medications on File Prior to Visit  Medication Sig Dispense Refill   allopurinol (ZYLOPRIM) 300 MG tablet Take 300 mg by mouth daily.      aspirin EC 81 MG EC tablet Take 1 tablet (81 mg total) by mouth daily. 1 tablet    atorvastatin (LIPITOR) 80 MG tablet TAKE ONE TABLET BY MOUTH DAILY 90 tablet 3   doxazosin (CARDURA) 4 MG tablet TAKE ONE TABLET BY MOUTH DAILY 90 tablet 3   lisinopril (ZESTRIL) 20 MG  tablet TAKE ONE TABLET BY MOUTH DAILY 90 tablet 3   metoprolol succinate (TOPROL-XL) 50 MG 24 hr tablet TAKE ONE TABLET BY MOUTH DAILY WITH OR IMMEDIATELY FOLLOWING A MEAL 90 tablet 3   Multiple Vitamin (MULTIVITAMIN PO) Take by mouth.     nitroGLYCERIN (NITROSTAT) 0.4 MG SL tablet Place 1 tablet (0.4 mg total) under the tongue every 5 (five) minutes as needed for chest pain. 25 tablet 6   pantoprazole (PROTONIX) 40 MG tablet Take 40 mg by mouth daily.     testosterone cypionate (DEPOTESTOSTERONE CYPIONATE) 200 MG/ML injection SMARTSIG:0.5 Milliliter(s) IM Every 2 Weeks     No current facility-administered medications on file prior to visit.    No Known Allergies  Assessment/Plan:  1. Hyperlipidemia - LDL-C is above goal of <55. Patient agreeable to starting PCSK9i. Will submit PA to insurance. Reviewed injection technique. Continue atrovastatin '80mg'$  daily.   Thank you,   Ramond Dial, Pharm.D, BCPS, CPP [place]  [address], [place], [place] [address]  Phone: [phone]; Fax: [phone]

## 2021-07-18 ENCOUNTER — Other Ambulatory Visit: Payer: Self-pay | Admitting: Cardiovascular Disease

## 2021-08-29 ENCOUNTER — Other Ambulatory Visit: Payer: Self-pay | Admitting: Cardiovascular Disease

## 2021-09-05 ENCOUNTER — Other Ambulatory Visit: Payer: Self-pay | Admitting: Cardiovascular Disease

## 2021-09-28 ENCOUNTER — Telehealth: Payer: Self-pay | Admitting: Cardiovascular Disease

## 2021-09-28 ENCOUNTER — Encounter: Payer: Self-pay | Admitting: Pharmacist

## 2021-09-28 NOTE — Telephone Encounter (Signed)
Patient chloresoral droped 101 to 38

## 2021-09-29 NOTE — Telephone Encounter (Signed)
See mychart message converstation. Labs in media tab. LCL-C 7.

## 2021-10-04 ENCOUNTER — Telehealth: Payer: Self-pay | Admitting: Cardiovascular Disease

## 2021-10-04 NOTE — Telephone Encounter (Signed)
Pt c/o medication issue:  1. Name of Medication:   atorvastatin (LIPITOR) 80 MG tablet    2. How are you currently taking this medication (dosage and times per day)?  TAKE ONE TABLET BY MOUTH DAILY 3. Are you having a reaction (difficulty breathing--STAT)? No  4. What is your medication issue? Would like to know if pt should still be taking medication due to Cholesterol being low. Please advise

## 2021-10-06 NOTE — Telephone Encounter (Signed)
Ramond Dial, RPH-CPP to Tim Edwards       10/06/21 11:12 AM Hi Mr. Sindelar,   That is a great questions! The answer is no, your cholesterol is not too low. The measurement of cholesterol in your blood by the lab is only a small proportion of the cholesterol in our body. Our cells produce a large amount of cholesterol (80%) and this is not measured in the lab. There have been long term studies looking at the patients in the Repatha trial whose LDL-C dropped below 25. There have been no adverse effects in these patients. If you wanted to decrease your atorvastatin to '40mg'$  (1/2 tablet), you certainly could do that. But atorvastatin and Repatha work well together and give an added benefit, therefore we do want you to be on both.   Hope this helps,  [place] to ensure pt understood to remain on both Lipitor and Repatha-he does, and no additional questions.

## 2021-11-03 ENCOUNTER — Ambulatory Visit: Payer: BC Managed Care – PPO | Admitting: Cardiovascular Disease

## 2021-11-04 ENCOUNTER — Encounter: Payer: Self-pay | Admitting: Cardiovascular Disease

## 2021-11-04 NOTE — Telephone Encounter (Signed)
Returned call to patient who wants a letter excusing him from [employer] duty. He denies any recent episodes of chest pain aside from worsening acid reflux as he has been off his protonix, no SOB, or other cardiac symptom. He states his BP in the last week has been as high as "186/101" because he has so much anxiety and stress in his life right now and the thought of jury duty is adding stress to him. Reviewed his medication regimen with him and he condones that he is still using his doxazosin, metoprolol, and lisinopril. Advised him that if his BP is running this high, he really should be seen. Pt denies needing an appt. I told him that I would type something up, but that I didn't have anything solid that I believed would excuse him from [employer] duty.

## 2021-12-15 ENCOUNTER — Encounter: Payer: Self-pay | Admitting: Cardiovascular Disease

## 2021-12-16 NOTE — Telephone Encounter (Signed)
I spoke with him and answered questions. thx

## 2022-06-05 ENCOUNTER — Other Ambulatory Visit: Payer: Self-pay | Admitting: Cardiovascular Disease

## 2022-06-06 ENCOUNTER — Encounter: Payer: Self-pay | Admitting: Cardiovascular Disease

## 2022-06-06 MED ORDER — LISINOPRIL 20 MG PO TABS: 20.0000 mg | ORAL_TABLET | Freq: Every day | ORAL | 0 refills | Status: DC

## 2022-06-06 NOTE — Telephone Encounter (Signed)
Per OV note on 06/09/21 by Excell Seltzer: The patient has had no further chest discomfort since his evaluation last year.  He likely was experiencing gastroesophageal reflux disease and his symptoms have resolved on Protonix and famotidine.  He will continue on his current medical program without change.  He is treated with aspirin, high intensity statin drug, and ACE inhibitor, and beta-blocker. Blood pressure is well controlled on lisinopril and metoprolol succinate  Pt due for yearly follow up in May, placed on Cooper's schedule for June (no prior availability) and medication sent into pharmacy for 3 month supply.

## 2022-07-10 ENCOUNTER — Ambulatory Visit: Payer: BC Managed Care – PPO | Admitting: Cardiovascular Disease

## 2022-07-18 ENCOUNTER — Other Ambulatory Visit: Payer: Self-pay | Admitting: Cardiovascular Disease

## 2022-07-26 ENCOUNTER — Other Ambulatory Visit: Payer: Self-pay | Admitting: Cardiovascular Disease

## 2022-08-02 ENCOUNTER — Other Ambulatory Visit (HOSPITAL_COMMUNITY): Payer: Self-pay

## 2022-08-02 ENCOUNTER — Telehealth: Payer: Self-pay

## 2022-08-02 NOTE — Telephone Encounter (Signed)
Pharmacy Patient Advocate Encounter   Received notification from [facility] that prior authorization for REPATHA is needed.    PA submitted on 08/02/22 Key [id number] Status is pending  Haze Rushing, CPhT Pharmacy Patient Advocate Specialist Direct Number: [phone] Fax: [phone]

## 2022-08-02 NOTE — Telephone Encounter (Signed)
Pt notified of approval

## 2022-08-02 NOTE — Telephone Encounter (Signed)
Pharmacy Patient Advocate Encounter  Prior Authorization for REPATHA has been approved.    Effective dates: 08/02/22 through 08/02/23  Lesette Frary, CPhT Pharmacy Patient Advocate Specialist Direct Number: [phone] Fax: [phone] 

## 2022-08-03 ENCOUNTER — Other Ambulatory Visit: Payer: Self-pay | Admitting: Cardiovascular Disease

## 2022-08-07 ENCOUNTER — Ambulatory Visit: Payer: BC Managed Care – PPO | Attending: Cardiovascular Disease | Admitting: Cardiovascular Disease

## 2022-08-07 ENCOUNTER — Encounter: Payer: Self-pay | Admitting: Cardiovascular Disease

## 2022-08-07 VITALS — BP 130/84 | HR 83 | Ht 74.0 in | Wt 265.0 lb

## 2022-08-07 DIAGNOSIS — E782 Mixed hyperlipidemia: Secondary | ICD-10-CM | POA: Diagnosis not present

## 2022-08-07 DIAGNOSIS — I10 Essential (primary) hypertension: Secondary | ICD-10-CM

## 2022-08-07 DIAGNOSIS — I2510 Atherosclerotic heart disease of native coronary artery without angina pectoris: Secondary | ICD-10-CM | POA: Diagnosis not present

## 2022-08-07 NOTE — Patient Instructions (Signed)
Medication Instructions:  Your physician recommends that you continue on your current medications as directed. Please refer to the Current Medication list given to you today.  *If you need a refill on your cardiac medications before your next appointment, please call your pharmacy*  Follow-Up: At [facility], you and your health needs are our priority.  As part of our continuing mission to provide you with exceptional heart care, we have created designated Provider Care Teams.  These Care Teams include your primary Cardiologist (physician) and Advanced Practice Providers (APPs -  Physician Assistants and Nurse Practitioners) who all work together to provide you with the care you need, when you need it.  Your next appointment:   1 year(s)  Provider:   Michael Cooper, MD     

## 2022-08-07 NOTE — Progress Notes (Unsigned)
Cardiology Office Note:    Date:  08/10/2022   ID:  Tim Edwards, DOB 16-Mar-1965, MRN [id number]  PCP:  Darrin Nipper, MD   [facility] Providers Cardiologist:  Tonny Bollman, MD     Referring MD: Darrin Nipper, MD   Chief Complaint  Patient presents with   Coronary Artery Disease    History of Present Illness:    Tim Edwards is a 57 y.o. male with a hx of coronary artery disease, presenting for follow-up evaluation.  The patient presented in 2010 with non-STEMI and was noted to have severe stenosis in his LAD, treated with PCI using a drug-eluting stent.  His LV function has been normal with an LVEF of 55%.  He has had no interval cardiac problems and has maintained a good medical regimen.  In review of his cardiac catheterization report, he was noted to have severe stenosis of the proximal LAD and was treated with a 3.0 x 23 mm Xience DES postdilated with a 3.5 mm noncompliant balloon.  He also was noted to have subtotal occlusion of the left PDA with the lesion unfavorable for PCI.  His RCA was small and nondominant.  He was not found to have any other obstructive disease.  LVEF was 55%.  The patient's last stress Myoview scan in 2022 showed no ischemia.  The patient is here alone today.  He is doing well.  He denies symptoms of chest pain, chest pressure, shortness of breath, or heart palpitations.  He has not been as active as in the past, but remains busy with his work.  He is compliant with his medications.  Past Medical History:  Diagnosis Date   CAD (coronary artery disease)    a. s/p Xience DES to LAD 4/10;  b. cath 05/28/08: pLAD 90% (tx with DES); left PDA occluded, RCA ok; EF 55%   Dyslipidemia    Headache    Hyperlipidemia    Hypertension    Personal history of colonic polyps - tubular adenomas 08/27/2013    Past Surgical History:  Procedure Laterality Date   COLONOSCOPY     CORONARY ANGIOPLASTY WITH STENT PLACEMENT     VASECTOMY       Current Medications: Current Meds  Medication Sig   allopurinol (ZYLOPRIM) 300 MG tablet Take 300 mg by mouth daily.    aspirin EC 81 MG EC tablet Take 1 tablet (81 mg total) by mouth daily.   atorvastatin (LIPITOR) 80 MG tablet TAKE ONE TABLET BY MOUTH DAILY   doxazosin (CARDURA) 4 MG tablet TAKE ONE TABLET BY MOUTH DAILY   lisinopril (ZESTRIL) 20 MG tablet Take 1 tablet (20 mg total) by mouth daily.   metoprolol succinate (TOPROL-XL) 50 MG 24 hr tablet TAKE ONE TABLET BY MOUTH DAILY WITH OR IMMEDIATELY FOLLOWING A MEAL   Multiple Vitamin (MULTIVITAMIN PO) Take by mouth.   nitroGLYCERIN (NITROSTAT) 0.4 MG SL tablet Place 1 tablet (0.4 mg total) under the tongue every 5 (five) minutes as needed for chest pain.   pantoprazole (PROTONIX) 40 MG tablet Take 40 mg by mouth daily.   REPATHA SURECLICK 140 MG/ML SOAJ INJECT 1 PEN INTO THE SKIN EVERY 14 DAYS   testosterone cypionate (DEPOTESTOSTERONE CYPIONATE) 200 MG/ML injection SMARTSIG:0.5 Milliliter(s) IM Every 2 Weeks     Allergies:   Patient has no known allergies.   Social History   Socioeconomic History   Marital status: Married    Spouse name: Not on file   Number of children: Not  on file   Years of education: Not on file   Highest education level: Not on file  Occupational History   Not on file  Tobacco Use   Smoking status: Former    Packs/day: 1.00    Years: 20.00    Additional pack years: 0.00    Total pack years: 20.00    Types: Cigarettes   Smokeless tobacco: Never  Vaping Use   Vaping Use: Every day  Substance and Sexual Activity   Alcohol use: Yes    Comment: socially   Drug use: No   Sexual activity: Not on file  Other Topics Concern   Not on file  Social History Narrative   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 . Former smoker  He drinks socially. Denies any recreational substance use. No diet restrictions.    Social Determinants of Health   Financial Resource Strain: Not  on file  Food Insecurity: Not on file  Transportation Needs: Not on file  Physical Activity: Not on file  Stress: Not on file  Social Connections: Not on file     Family History: The patient's family history includes Aneurysm in his mother; Cancer in his father; Colon cancer in his mother; Crohn's disease in his brother. There is no history of Esophageal cancer, Stomach cancer, Rectal cancer, or Colon polyps.  ROS:   Please see the history of present illness.    All other systems reviewed and are negative.  EKGs/Labs/Other Studies Reviewed:    The following studies were reviewed today: Stress Myoview scan 05/27/2020: The left ventricular ejection fraction is normal (55-65%). Nuclear stress EF: 58%. There was no ST segment deviation noted during stress. There is a medium defect of moderate severity present in the mid anteroseptal, apical inferior and apical lateral location. The defect is non-reversible. and in the setting of normal wall motion, is most consistent with diaphragmatic attenuation artifact and extracardiac uptake. No ischemia noted. This is a low risk study.      Recent Labs: No results found for requested labs within last 365 days.  Recent Lipid Panel    Component Value Date/Time   CHOL 120 11/09/2014 0754   TRIG 71.0 11/09/2014 0754   HDL 34.50 (L) 11/09/2014 0754   CHOLHDL 3 11/09/2014 0754   VLDL 14.2 11/09/2014 0754   LDLCALC 71 11/09/2014 0754     Risk Assessment/Calculations:            Physical Exam:    VS:  BP 130/84   Pulse 83   Ht 6\' 2"  (1.88 m)   Wt 265 lb (120.2 kg)   SpO2 95%   BMI 34.02 kg/m     Wt Readings from Last 3 Encounters:  08/07/22 265 lb (120.2 kg)  06/09/21 255 lb 3.2 oz (115.8 kg)  06/24/20 240 lb (108.9 kg)     GEN:  Well nourished, well developed in no acute distress HEENT: Normal NECK: No JVD; No carotid bruits LYMPHATICS: No lymphadenopathy CARDIAC: RRR, no murmurs, rubs, gallops RESPIRATORY:  Clear to  auscultation without rales, wheezing or rhonchi  ABDOMEN: Soft, non-tender, non-distended MUSCULOSKELETAL:  No edema; No deformity  SKIN: Warm and dry NEUROLOGIC:  Alert and oriented x 3 PSYCHIATRIC:  Normal affect   ASSESSMENT:    1. HYPERTENSION, BENIGN   2. Mixed hyperlipidemia   3. Coronary artery disease involving native coronary artery of native heart without angina pectoris    PLAN:    In order of problems listed  above:  Blood pressure is controlled on doxazosin, lisinopril, and metoprolol succinate.  Discussed the importance of weight loss, diet, and exercise with him.  His weight is trended and he is up 10 pounds from last year's visit. Treated with Repatha and atorvastatin.  He has had a very good response and labs are followed by his primary physician.  His LDL cholesterol is now less than 55 mg/dL. Doing well with no angina.  Last stress test reviewed as above.  Continues on aspirin and high intensity statin drug.           Medication Adjustments/Labs and Tests Ordered: Current medicines are reviewed at length with the patient today.  Concerns regarding medicines are outlined above.  Orders Placed This Encounter  Procedures   EKG 12-Lead   No orders of the defined types were placed in this encounter.   Patient Instructions  Medication Instructions:  Your physician recommends that you continue on your current medications as directed. Please refer to the Current Medication list given to you today.  *If you need a refill on your cardiac medications before your next appointment, please call your pharmacy*  Follow-Up: At [facility], you and your health needs are our priority.  As part of our continuing mission to provide you with exceptional heart care, we have created designated Provider Care Teams.  These Care Teams include your primary Cardiologist (physician) and Advanced Practice Providers (APPs -  Physician Assistants and Nurse Practitioners) who all  work together to provide you with the care you need, when you need it.  Your next appointment:   1 year(s)  Provider:   Tonny Bollman, MD       Signed, Tonny Bollman, MD  08/10/2022 10:50 AM    [facility]

## 2022-08-21 ENCOUNTER — Telehealth: Payer: Self-pay | Admitting: Cardiovascular Disease

## 2022-08-21 NOTE — Telephone Encounter (Signed)
Prior auth for Repatha

## 2022-08-21 NOTE — Telephone Encounter (Signed)
Pharmacy called in requesting a copy of pt's prior auth from 08/02/22 to be faxed over to them   Fax: [phone]

## 2022-08-23 NOTE — Telephone Encounter (Signed)
Tim Edwards from [place] for Repatha to [facility] @ [phone]

## 2022-09-08 ENCOUNTER — Other Ambulatory Visit: Payer: Self-pay | Admitting: Cardiovascular Disease

## 2022-09-13 ENCOUNTER — Other Ambulatory Visit: Payer: Self-pay | Admitting: Cardiovascular Disease

## 2022-10-26 DIAGNOSIS — M25522 Pain in left elbow: Secondary | ICD-10-CM | POA: Diagnosis not present

## 2022-11-02 DIAGNOSIS — M25522 Pain in left elbow: Secondary | ICD-10-CM | POA: Diagnosis not present

## 2023-01-15 DIAGNOSIS — I83892 Varicose veins of left lower extremities with other complications: Secondary | ICD-10-CM | POA: Diagnosis not present

## 2023-02-22 DIAGNOSIS — M19042 Primary osteoarthritis, left hand: Secondary | ICD-10-CM | POA: Diagnosis not present

## 2023-02-22 DIAGNOSIS — M1812 Unilateral primary osteoarthritis of first carpometacarpal joint, left hand: Secondary | ICD-10-CM | POA: Diagnosis not present

## 2023-06-25 ENCOUNTER — Other Ambulatory Visit: Payer: Self-pay | Admitting: Cardiovascular Disease

## 2023-07-30 ENCOUNTER — Other Ambulatory Visit: Payer: Self-pay | Admitting: Cardiovascular Disease

## 2023-08-13 ENCOUNTER — Other Ambulatory Visit: Payer: Self-pay | Admitting: Cardiovascular Disease

## 2023-08-27 ENCOUNTER — Other Ambulatory Visit: Payer: Self-pay | Admitting: Cardiovascular Disease

## 2023-09-26 ENCOUNTER — Encounter: Payer: Self-pay | Admitting: Cardiovascular Disease

## 2023-09-26 DIAGNOSIS — I2510 Atherosclerotic heart disease of native coronary artery without angina pectoris: Secondary | ICD-10-CM

## 2023-09-26 DIAGNOSIS — E782 Mixed hyperlipidemia: Secondary | ICD-10-CM

## 2023-10-01 ENCOUNTER — Other Ambulatory Visit: Payer: Self-pay | Admitting: Cardiovascular Disease

## 2023-10-03 ENCOUNTER — Encounter: Payer: Self-pay | Admitting: Cardiovascular Disease

## 2023-10-03 ENCOUNTER — Ambulatory Visit: Attending: Cardiovascular Disease | Admitting: Cardiovascular Disease

## 2023-10-03 VITALS — BP 110/82 | HR 82 | Ht 74.0 in | Wt 270.2 lb

## 2023-10-03 DIAGNOSIS — E782 Mixed hyperlipidemia: Secondary | ICD-10-CM

## 2023-10-03 DIAGNOSIS — Z6834 Body mass index (BMI) 34.0-34.9, adult: Secondary | ICD-10-CM

## 2023-10-03 DIAGNOSIS — I2510 Atherosclerotic heart disease of native coronary artery without angina pectoris: Secondary | ICD-10-CM | POA: Diagnosis not present

## 2023-10-03 DIAGNOSIS — I10 Essential (primary) hypertension: Secondary | ICD-10-CM

## 2023-10-03 MED ORDER — AMLODIPINE BESYLATE 10 MG PO TABS
10.0000 mg | ORAL_TABLET | Freq: Every day | ORAL | 3 refills | Status: AC
Start: 2023-10-03 — End: ?

## 2023-10-03 NOTE — Progress Notes (Signed)
 Cardiology Office Note:    Date:  10/03/2023   ID:  ZAE KIRTZ, DOB Dec 03, 1965, MRN [id number]  PCP:  Yasmin Sauer, MD   [facility] Providers Cardiologist:  Ozell Fell, MD     Referring MD: Yasmin Sauer, MD   Chief Complaint  Patient presents with   Coronary Artery Disease    History of Present Illness:    Tim Edwards is a 58 y.o. male with a hx of coronary artery disease, presenting for follow-up evaluation.  The patient presented in 2010 with non-STEMI and was noted to have severe stenosis in his LAD, treated with PCI using a drug-eluting stent.  His LV function has been normal with an LVEF of 55%.  He has had no interval cardiac problems and has maintained a good medical regimen.  In review of his cardiac catheterization report, he was noted to have severe stenosis of the proximal LAD and was treated with a 3.0 x 23 mm Xience DES postdilated with a 3.5 mm noncompliant balloon.  He also was noted to have subtotal occlusion of the left PDA with the lesion unfavorable for PCI.  His RCA was small and nondominant.  He was not found to have any other obstructive disease.  LVEF was 55%.  The patient's last stress Myoview  scan in 2022 showed no ischemia.   Patient is here alone today.  He has been doing okay from a cardiac perspective but he any chest pain, chest pressure, or shortness of breath.  He is physically active with no exertional symptoms.  He does have some chronic lower leg edema.  The patient ran out of his medications and had difficult time getting refills so his recent labs reflect him being off of atorvastatin  for several weeks.  He does bring in lab results that I reviewed on his smart phone from recent blood work.  Hemoglobin A1c is 5.5.  AST and ALT are 35 and 51, respectively.  Cholesterol is 157, triglycerides 243, LDL cholesterol 79.  Creatinine is 1.15, sodium 142, potassium 4.6, hemoglobin 15.9, and platelets 140,000.   Current  Medications: Current Meds  Medication Sig   allopurinol (ZYLOPRIM) 300 MG tablet Take 300 mg by mouth daily.    aspirin EC 81 MG EC tablet Take 1 tablet (81 mg total) by mouth daily.   atorvastatin  (LIPITOR) 80 MG tablet TAKE ONE TABLET BY MOUTH ONCE DAILY *NEEDS OFFICE VISIT FOR FURTHER REFILLS*   Azelastine-Fluticasone 137-50 MCG/ACT SUSP as needed.   doxazosin  (CARDURA ) 4 MG tablet TAKE ONE TABLET BY MOUTH DAILY   famotidine (PEPCID) 40 MG tablet Take 40 mg by mouth daily.   fluticasone (FLONASE) 50 MCG/ACT nasal spray as needed.   metoprolol  succinate (TOPROL -XL) 50 MG 24 hr tablet TAKE ONE TABLET BY MOUTH DAILY WITH OR IMMEDIATELY FOLLOWING A MEAL   Multiple Vitamin (MULTIVITAMIN PO) Take by mouth.   nitroGLYCERIN  (NITROSTAT ) 0.4 MG SL tablet Place 1 tablet (0.4 mg total) under the tongue every 5 (five) minutes as needed for chest pain.   pantoprazole (PROTONIX) 40 MG tablet Take 40 mg by mouth daily.   REPATHA  SURECLICK 140 MG/ML SOAJ INJECT 1 PEN INTO THE SKIN EVERY 14 DAYS   testosterone  cypionate (DEPOTESTOSTERONE CYPIONATE) 200 MG/ML injection SMARTSIG:0.5 Milliliter(s) IM Every 2 Weeks   [DISCONTINUED] amLODipine  (NORVASC ) 10 MG tablet Take 10 mg by mouth daily.   [DISCONTINUED] lisinopril  (ZESTRIL ) 20 MG tablet TAKE ONE TABLET BY MOUTH DAILY *NEEDS OFFICE VISIT FOR FURTHER REFILLS*  Allergies:   Lisinopril    ROS:   Please see the history of present illness.    All other systems reviewed and are negative.  EKGs/Labs/Other Studies Reviewed:    The following studies were reviewed today: Cardiac Studies & Procedures   ______________________________________________________________________________________________   STRESS TESTS  MYOCARDIAL PERFUSION IMAGING 05/27/2020  Interpretation Summary  The left ventricular ejection fraction is normal (55-65%).  Nuclear stress EF: 58%.  There was no ST segment deviation noted during stress.  There is a medium defect of  moderate severity present in the mid anteroseptal, apical inferior and apical lateral location. The defect is non-reversible. and in the setting of normal wall motion, is most consistent with diaphragmatic attenuation artifact and extracardiac uptake. No ischemia noted.  This is a low risk study.            ______________________________________________________________________________________________      EKG:   EKG Interpretation Date/Time:  Wednesday October 03 2023 10:25:41 EDT Ventricular Rate:  82 PR Interval:  158 QRS Duration:  92 QT Interval:  358 QTC Calculation: 418 R Axis:   -10  Text Interpretation: Normal sinus rhythm with sinus arrhythmia Minimal voltage criteria for LVH, may be normal variant ( R in aVL ) Inferior infarct (cited on or before 07-Aug-2022) When compared with ECG of 07-Aug-2022 16:12, No significant change was found Confirmed by Wonda Sharper [phone]) on 10/03/2023 10:59:19 AM    Recent Labs: No results found for requested labs within last 365 days.  Recent Lipid Panel    Component Value Date/Time   CHOL 120 11/09/2014 0754   TRIG 71.0 11/09/2014 0754   HDL 34.50 (L) 11/09/2014 0754   CHOLHDL 3 11/09/2014 0754   VLDL 14.2 11/09/2014 0754   LDLCALC 71 11/09/2014 0754     Risk Assessment/Calculations:                Physical Exam:    VS:  BP 110/82   Pulse 82   Ht 6' 2 (1.88 m)   Wt 270 lb 3.2 oz (122.6 kg)   SpO2 96%   BMI 34.69 kg/m     Wt Readings from Last 3 Encounters:  10/03/23 270 lb 3.2 oz (122.6 kg)  08/07/22 265 lb (120.2 kg)  06/09/21 255 lb 3.2 oz (115.8 kg)     GEN:  Well nourished, well developed in no acute distress HEENT: Normal NECK: No JVD; No carotid bruits LYMPHATICS: No lymphadenopathy CARDIAC: RRR, no murmurs, rubs, gallops RESPIRATORY:  Clear to auscultation without rales, wheezing or rhonchi  ABDOMEN: Soft, non-tender, non-distended MUSCULOSKELETAL:  No edema; No deformity  SKIN: Warm and  dry NEUROLOGIC:  Alert and oriented x 3 PSYCHIATRIC:  Normal affect   Assessment & Plan Coronary artery disease involving native coronary artery of native heart without angina pectoris Continue aspirin for antiplatelet therapy, atorvastatin  80 mg daily, Repatha , and metoprolol  succinate. Mixed hyperlipidemia Treated with atorvastatin  and Repatha .  Last lipids with LDL above goal, but he had been off of atorvastatin  for several weeks.  LFTs mildly elevated with an ALT of 51.  Discussed the importance of weight loss. HYPERTENSION, BENIGN Blood pressure controlled on metoprolol  succinate.  No longer taking lisinopril  because of cough. BMI 34.0-34.9,adult Discussed his weight gain.  I trended his weights over the last 3 years and he has had consistent weight gain.  He is exhibiting some symptoms of obstructive sleep apnea that he is not able to tolerate CPAP.  He understands the adverse impact of obesity on his  cardiovascular problems and overall health risks.  He is motivated to lose weight and get healthier and plans to start intermittent fasting on September 1.     Medication Adjustments/Labs and Tests Ordered: Current medicines are reviewed at length with the patient today.  Concerns regarding medicines are outlined above.  Orders Placed This Encounter  Procedures   EKG 12-Lead   Meds ordered this encounter  Medications   amLODipine  (NORVASC ) 10 MG tablet    Sig: Take 1 tablet (10 mg total) by mouth daily.    Dispense:  90 tablet    Refill:  3    Patient Instructions  Medication Instructions:  Refill for Amlodipine  sent to pharmacy.  *If you need a refill on your cardiac medications before your next appointment, please call your pharmacy*  Lab Work: None ordered today. If you have labs (blood work) drawn today and your tests are completely normal, you will receive your results only by: MyChart Message (if you have MyChart) OR A paper copy in the mail If you have any lab test  that is abnormal or we need to change your treatment, we will call you to review the results.  Testing/Procedures: None ordered today.  Follow-Up: At [facility], you and your health needs are our priority.  As part of our continuing mission to provide you with exceptional heart care, our providers are all part of one team.  This team includes your primary Cardiologist (physician) and Advanced Practice Providers or APPs (Physician Assistants and Nurse Practitioners) who all work together to provide you with the care you need, when you need it.  Your next appointment:   1 year(s)  Provider:   Ozell Fell, MD      Signed, Ozell Fell, MD  10/03/2023 12:13 PM    [facility]

## 2023-10-03 NOTE — Assessment & Plan Note (Signed)
 Blood pressure controlled on metoprolol  succinate.  No longer taking lisinopril  because of cough.

## 2023-10-03 NOTE — Patient Instructions (Signed)
 Medication Instructions:  Refill for Amlodipine  sent to pharmacy.  *If you need a refill on your cardiac medications before your next appointment, please call your pharmacy*  Lab Work: None ordered today. If you have labs (blood work) drawn today and your tests are completely normal, you will receive your results only by: MyChart Message (if you have MyChart) OR A paper copy in the mail If you have any lab test that is abnormal or we need to change your treatment, we will call you to review the results.  Testing/Procedures: None ordered today.  Follow-Up: At [facility], you and your health needs are our priority.  As part of our continuing mission to provide you with exceptional heart care, our providers are all part of one team.  This team includes your primary Cardiologist (physician) and Advanced Practice Providers or APPs (Physician Assistants and Nurse Practitioners) who all work together to provide you with the care you need, when you need it.  Your next appointment:   1 year(s)  Provider:   Ozell Fell, MD

## 2023-10-03 NOTE — Assessment & Plan Note (Signed)
 Treated with atorvastatin  and Repatha .  Last lipids with LDL above goal, but he had been off of atorvastatin  for several weeks.  LFTs mildly elevated with an ALT of 51.  Discussed the importance of weight loss.

## 2023-10-03 NOTE — Assessment & Plan Note (Signed)
 Continue aspirin for antiplatelet therapy, atorvastatin  80 mg daily, Repatha , and metoprolol  succinate.

## 2023-10-09 ENCOUNTER — Telehealth: Payer: Self-pay | Admitting: Pharmacy Technician

## 2023-10-09 NOTE — Telephone Encounter (Signed)
 Pharmacy Patient Advocate Encounter  Received notification from [facility] that Prior Authorization for repatha  has been APPROVED from 10/09/23 to 10/08/24   PA #/Case ID/Reference #: [id number]

## 2023-10-09 NOTE — Telephone Encounter (Signed)
   Pharmacy Patient Advocate Encounter   Received notification from Onbase that prior authorization for repatha  is required/requested.   Insurance verification completed.   The patient is insured through [facility] .   Per test claim: PA required; PA submitted to above mentioned insurance via Latent Key/confirmation #/EOC [id number] Status is pending

## 2023-10-10 MED ORDER — REPATHA SURECLICK 140 MG/ML ~~LOC~~ SOAJ: 140.0000 mg | SUBCUTANEOUS | 0 refills | Status: AC

## 2023-10-11 MED ORDER — LOSARTAN POTASSIUM 25 MG PO TABS
25.0000 mg | ORAL_TABLET | Freq: Every day | ORAL | 3 refills | Status: AC
Start: 2023-10-11 — End: 2024-01-09

## 2023-10-11 NOTE — Telephone Encounter (Signed)
 Yes fine to refill losartan  25 mg for him. thx

## 2023-10-11 NOTE — Addendum Note (Signed)
 Addended by: LORING ANDRIETTE HERO on: 10/11/2023 01:00 PM   Modules accepted: Orders

## 2023-11-12 ENCOUNTER — Other Ambulatory Visit: Payer: Self-pay

## 2023-11-12 ENCOUNTER — Telehealth: Payer: Self-pay

## 2023-11-12 ENCOUNTER — Other Ambulatory Visit: Payer: Self-pay | Admitting: Cardiovascular Disease

## 2023-11-12 MED ORDER — METOPROLOL SUCCINATE ER 50 MG PO TB24: 50.0000 mg | ORAL_TABLET | Freq: Every day | ORAL | 3 refills | Status: AC

## 2023-11-12 NOTE — Telephone Encounter (Signed)
-----   Message from Ozell Fell sent at 11/12/2023 11:10 AM EDT ----- Keven can you put this request in his chart? I don't know how to document refill requests. He saw me 8/27 and takes Toprol . It's just a refill of one his baseline cardiac meds. Thx ----- Message ----- From: Bluford Donny BIRCH, CMA Sent: 11/12/2023  10:06 AM EDT To: Ozell Fell, MD; Ronal SHAUNNA Huron, RN  Can we please put this request in pt's chart. All documentation must be in pt's chart.  thanks ----- Message ----- From: Fell Ozell, MD Sent: 11/12/2023   9:59 AM EDT To: Ronal SHAUNNA Huron, RN; [facility] Refills  This patient reached out for metoprolol  succinate refill, recently seen in office. Please refill. thanks

## 2023-11-12 NOTE — Telephone Encounter (Signed)
 Pt reached out to Dr. Wonda regarding needing a refill of Metoprolol  Succinate. Refill has been sent to pt preferred pharmacy. Pt has been made aware.
